# Patient Record
Sex: Female | Born: 1945 | Hispanic: No | Smoking: Former smoker
Health system: Southern US, Community
[De-identification: ages and names within clinical notes are randomized; demographics above are authoritative.]

## PROBLEM LIST (undated history)

## (undated) DIAGNOSIS — I10 Essential (primary) hypertension: Secondary | ICD-10-CM

## (undated) DIAGNOSIS — D573 Sickle-cell trait: Secondary | ICD-10-CM

## (undated) DIAGNOSIS — Z720 Tobacco use: Secondary | ICD-10-CM

## (undated) HISTORY — DX: Sickle-cell trait: D57.3

---

## 1999-10-31 ENCOUNTER — Other Ambulatory Visit: Admission: RE | Admit: 1999-10-31 | Discharge: 1999-10-31 | Payer: Self-pay | Admitting: Gynecology

## 2000-10-31 ENCOUNTER — Other Ambulatory Visit: Admission: RE | Admit: 2000-10-31 | Discharge: 2000-10-31 | Payer: Self-pay | Admitting: Gynecology

## 2004-09-08 ENCOUNTER — Other Ambulatory Visit: Admission: RE | Admit: 2004-09-08 | Discharge: 2004-09-08 | Payer: Self-pay | Admitting: Gynecology

## 2017-02-13 ENCOUNTER — Ambulatory Visit (INDEPENDENT_AMBULATORY_CARE_PROVIDER_SITE_OTHER): Payer: Medicare PPO | Admitting: Urgent Care

## 2017-02-13 DIAGNOSIS — Z23 Encounter for immunization: Secondary | ICD-10-CM | POA: Diagnosis not present

## 2017-02-13 DIAGNOSIS — R030 Elevated blood-pressure reading, without diagnosis of hypertension: Secondary | ICD-10-CM | POA: Diagnosis not present

## 2017-02-13 DIAGNOSIS — M25561 Pain in right knee: Secondary | ICD-10-CM | POA: Diagnosis not present

## 2017-02-13 DIAGNOSIS — M25511 Pain in right shoulder: Secondary | ICD-10-CM | POA: Diagnosis not present

## 2017-02-13 DIAGNOSIS — M545 Low back pain, unspecified: Secondary | ICD-10-CM

## 2017-02-13 MED ORDER — NAPROXEN SODIUM 550 MG PO TABS: 550.0000 mg | ORAL_TABLET | Freq: Two times a day (BID) | ORAL | 1 refills | Status: DC

## 2017-02-13 MED ORDER — CYCLOBENZAPRINE HCL 5 MG PO TABS: 5.0000 mg | ORAL_TABLET | Freq: Three times a day (TID) | ORAL | 1 refills | Status: AC | PRN
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
## 2017-02-22 ENCOUNTER — Ambulatory Visit (INDEPENDENT_AMBULATORY_CARE_PROVIDER_SITE_OTHER): Payer: Medicare PPO | Admitting: Urgent Care

## 2017-02-22 ENCOUNTER — Encounter: Payer: Self-pay | Admitting: Urgent Care

## 2017-02-22 VITALS — BP 170/84 | HR 82 | Temp 98.7°F | Resp 16 | Ht 65.5 in | Wt 184.0 lb

## 2017-02-22 DIAGNOSIS — M25511 Pain in right shoulder: Secondary | ICD-10-CM

## 2017-02-22 DIAGNOSIS — R030 Elevated blood-pressure reading, without diagnosis of hypertension: Secondary | ICD-10-CM

## 2017-02-22 DIAGNOSIS — I10 Essential (primary) hypertension: Secondary | ICD-10-CM

## 2017-02-22 DIAGNOSIS — F329 Major depressive disorder, single episode, unspecified: Secondary | ICD-10-CM

## 2017-02-22 DIAGNOSIS — F32A Depression, unspecified: Secondary | ICD-10-CM

## 2017-02-22 MED ORDER — AMLODIPINE BESYLATE 5 MG PO TABS: 5.0000 mg | ORAL_TABLET | Freq: Every day | ORAL | 3 refills | Status: AC
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
## 2017-03-29 ENCOUNTER — Ambulatory Visit: Payer: Medicare PPO | Admitting: Urgent Care

## 2020-09-07 ENCOUNTER — Other Ambulatory Visit: Payer: Self-pay

## 2020-09-10 ENCOUNTER — Inpatient Hospital Stay (HOSPITAL_COMMUNITY)
Admission: EM | Admit: 2020-09-10 | Discharge: 2020-10-11 | DRG: 208 | Disposition: E | Payer: Medicare PPO | Attending: Internal Medicine | Admitting: Internal Medicine

## 2020-09-10 ENCOUNTER — Encounter (HOSPITAL_COMMUNITY): Payer: Self-pay | Admitting: Internal Medicine

## 2020-09-10 ENCOUNTER — Emergency Department (HOSPITAL_COMMUNITY): Payer: Medicare PPO

## 2020-09-10 DIAGNOSIS — Z66 Do not resuscitate: Secondary | ICD-10-CM | POA: Diagnosis not present

## 2020-09-10 DIAGNOSIS — R54 Age-related physical debility: Secondary | ICD-10-CM | POA: Diagnosis present

## 2020-09-10 DIAGNOSIS — Z833 Family history of diabetes mellitus: Secondary | ICD-10-CM

## 2020-09-10 DIAGNOSIS — D72829 Elevated white blood cell count, unspecified: Secondary | ICD-10-CM | POA: Diagnosis not present

## 2020-09-10 DIAGNOSIS — I351 Nonrheumatic aortic (valve) insufficiency: Secondary | ICD-10-CM | POA: Diagnosis not present

## 2020-09-10 DIAGNOSIS — I4891 Unspecified atrial fibrillation: Secondary | ICD-10-CM | POA: Diagnosis not present

## 2020-09-10 DIAGNOSIS — I82453 Acute embolism and thrombosis of peroneal vein, bilateral: Secondary | ICD-10-CM | POA: Diagnosis present

## 2020-09-10 DIAGNOSIS — E875 Hyperkalemia: Secondary | ICD-10-CM | POA: Diagnosis not present

## 2020-09-10 DIAGNOSIS — N179 Acute kidney failure, unspecified: Secondary | ICD-10-CM | POA: Diagnosis present

## 2020-09-10 DIAGNOSIS — J9601 Acute respiratory failure with hypoxia: Secondary | ICD-10-CM | POA: Diagnosis present

## 2020-09-10 DIAGNOSIS — U071 COVID-19: Secondary | ICD-10-CM | POA: Diagnosis present

## 2020-09-10 DIAGNOSIS — I10 Essential (primary) hypertension: Secondary | ICD-10-CM | POA: Diagnosis present

## 2020-09-10 DIAGNOSIS — R7401 Elevation of levels of liver transaminase levels: Secondary | ICD-10-CM | POA: Diagnosis present

## 2020-09-10 DIAGNOSIS — I82451 Acute embolism and thrombosis of right peroneal vein: Secondary | ICD-10-CM | POA: Diagnosis present

## 2020-09-10 DIAGNOSIS — Z87891 Personal history of nicotine dependence: Secondary | ICD-10-CM | POA: Diagnosis not present

## 2020-09-10 DIAGNOSIS — Z515 Encounter for palliative care: Secondary | ICD-10-CM | POA: Diagnosis not present

## 2020-09-10 DIAGNOSIS — R739 Hyperglycemia, unspecified: Secondary | ICD-10-CM | POA: Diagnosis present

## 2020-09-10 DIAGNOSIS — Z0189 Encounter for other specified special examinations: Secondary | ICD-10-CM

## 2020-09-10 DIAGNOSIS — R748 Abnormal levels of other serum enzymes: Secondary | ICD-10-CM | POA: Diagnosis not present

## 2020-09-10 DIAGNOSIS — I82403 Acute embolism and thrombosis of unspecified deep veins of lower extremity, bilateral: Secondary | ICD-10-CM | POA: Diagnosis not present

## 2020-09-10 DIAGNOSIS — I361 Nonrheumatic tricuspid (valve) insufficiency: Secondary | ICD-10-CM | POA: Diagnosis not present

## 2020-09-10 DIAGNOSIS — I82441 Acute embolism and thrombosis of right tibial vein: Secondary | ICD-10-CM | POA: Diagnosis present

## 2020-09-10 DIAGNOSIS — J1282 Pneumonia due to coronavirus disease 2019: Secondary | ICD-10-CM | POA: Diagnosis present

## 2020-09-10 DIAGNOSIS — D75839 Thrombocytosis, unspecified: Secondary | ICD-10-CM | POA: Diagnosis present

## 2020-09-10 DIAGNOSIS — R0902 Hypoxemia: Secondary | ICD-10-CM

## 2020-09-10 DIAGNOSIS — Z7189 Other specified counseling: Secondary | ICD-10-CM | POA: Diagnosis not present

## 2020-09-10 DIAGNOSIS — D573 Sickle-cell trait: Secondary | ICD-10-CM | POA: Diagnosis present

## 2020-09-10 DIAGNOSIS — I82443 Acute embolism and thrombosis of tibial vein, bilateral: Secondary | ICD-10-CM | POA: Diagnosis present

## 2020-09-10 DIAGNOSIS — R0603 Acute respiratory distress: Secondary | ICD-10-CM

## 2020-09-10 DIAGNOSIS — N170 Acute kidney failure with tubular necrosis: Secondary | ICD-10-CM | POA: Diagnosis present

## 2020-09-10 DIAGNOSIS — B370 Candidal stomatitis: Secondary | ICD-10-CM | POA: Diagnosis not present

## 2020-09-10 DIAGNOSIS — I82442 Acute embolism and thrombosis of left tibial vein: Secondary | ICD-10-CM | POA: Diagnosis present

## 2020-09-10 DIAGNOSIS — R0602 Shortness of breath: Secondary | ICD-10-CM

## 2020-09-10 DIAGNOSIS — R7989 Other specified abnormal findings of blood chemistry: Secondary | ICD-10-CM | POA: Diagnosis not present

## 2020-09-10 DIAGNOSIS — I82452 Acute embolism and thrombosis of left peroneal vein: Secondary | ICD-10-CM | POA: Diagnosis present

## 2020-09-10 HISTORY — DX: Essential (primary) hypertension: I10

## 2020-09-10 HISTORY — DX: Tobacco use: Z72.0

## 2020-09-10 LAB — BRAIN NATRIURETIC PEPTIDE: B Natriuretic Peptide: 24.5 pg/mL (ref 0.0–100.0)

## 2020-09-10 LAB — COMPREHENSIVE METABOLIC PANEL
ALT: 52 U/L — ABNORMAL HIGH (ref 0–44)
AST: 46 U/L — ABNORMAL HIGH (ref 15–41)
Albumin: 3.1 g/dL — ABNORMAL LOW (ref 3.5–5.0)
Alkaline Phosphatase: 67 U/L (ref 38–126)
Anion gap: 17 — ABNORMAL HIGH (ref 5–15)
BUN: 64 mg/dL — ABNORMAL HIGH (ref 8–23)
CO2: 17 mmol/L — ABNORMAL LOW (ref 22–32)
Calcium: 9.1 mg/dL (ref 8.9–10.3)
Chloride: 107 mmol/L (ref 98–111)
Creatinine, Ser: 1.37 mg/dL — ABNORMAL HIGH (ref 0.44–1.00)
GFR calc Af Amer: 44 mL/min — ABNORMAL LOW (ref >60)
GFR calc non Af Amer: 38 mL/min — ABNORMAL LOW (ref >60)
Glucose, Bld: 197 mg/dL — ABNORMAL HIGH (ref 70–99)
Potassium: 4.2 mmol/L (ref 3.5–5.1)
Sodium: 141 mmol/L (ref 135–145)
Total Bilirubin: 1.3 mg/dL — ABNORMAL HIGH (ref 0.3–1.2)
Total Protein: 7.7 g/dL (ref 6.5–8.1)

## 2020-09-10 LAB — CBC WITH DIFFERENTIAL/PLATELET
Abs Immature Granulocytes: 0 10*3/uL (ref 0.00–0.07)
Basophils Absolute: 0 10*3/uL (ref 0.0–0.1)
Basophils Relative: 0 %
Eosinophils Absolute: 0 10*3/uL (ref 0.0–0.5)
Eosinophils Relative: 0 %
HCT: 40.5 % (ref 36.0–46.0)
Hemoglobin: 14.2 g/dL (ref 12.0–15.0)
Lymphocytes Relative: 14 %
Lymphs Abs: 1.4 10*3/uL (ref 0.7–4.0)
MCH: 27.4 pg (ref 26.0–34.0)
MCHC: 35.1 g/dL (ref 30.0–36.0)
MCV: 78.2 fL — ABNORMAL LOW (ref 80.0–100.0)
Monocytes Absolute: 0.5 10*3/uL (ref 0.1–1.0)
Monocytes Relative: 5 %
Neutro Abs: 8.1 10*3/uL — ABNORMAL HIGH (ref 1.7–7.7)
Neutrophils Relative %: 81 %
Platelets: 552 10*3/uL — ABNORMAL HIGH (ref 150–400)
RBC: 5.18 MIL/uL — ABNORMAL HIGH (ref 3.87–5.11)
RDW: 12.2 % (ref 11.5–15.5)
WBC: 10 10*3/uL (ref 4.0–10.5)
nRBC: 0 % (ref 0.0–0.2)
nRBC: 1 /100 WBC — ABNORMAL HIGH

## 2020-09-10 LAB — LACTIC ACID, PLASMA
Lactic Acid, Venous: 3.1 mmol/L (ref 0.5–1.9)
Lactic Acid, Venous: 1.9 mmol/L (ref 0.5–1.9)

## 2020-09-10 LAB — CREATININE, SERUM
Creatinine, Ser: 0.95 mg/dL (ref 0.44–1.00)
GFR calc Af Amer: >60 mL/min (ref >60)
GFR calc non Af Amer: 59 mL/min — ABNORMAL LOW (ref >60)

## 2020-09-10 LAB — RESPIRATORY PANEL BY RT PCR (FLU A&B, COVID)
Influenza A by PCR: NEGATIVE
Influenza B by PCR: NEGATIVE
SARS Coronavirus 2 by RT PCR: POSITIVE — AB

## 2020-09-10 LAB — CBC
HCT: 40.5 % (ref 36.0–46.0)
Hemoglobin: 14.6 g/dL (ref 12.0–15.0)
MCH: 27.8 pg (ref 26.0–34.0)
MCHC: 36 g/dL (ref 30.0–36.0)
MCV: 77.1 fL — ABNORMAL LOW (ref 80.0–100.0)
Platelets: 461 10*3/uL — ABNORMAL HIGH (ref 150–400)
RBC: 5.25 MIL/uL — ABNORMAL HIGH (ref 3.87–5.11)
RDW: 12.1 % (ref 11.5–15.5)
WBC: 7.7 10*3/uL (ref 4.0–10.5)
nRBC: 0 % (ref 0.0–0.2)

## 2020-09-10 LAB — LACTATE DEHYDROGENASE: LDH: 570 U/L — ABNORMAL HIGH (ref 98–192)

## 2020-09-10 LAB — FIBRINOGEN: Fibrinogen: >800 mg/dL — ABNORMAL HIGH (ref 210–475)

## 2020-09-10 LAB — D-DIMER, QUANTITATIVE: D-Dimer, Quant: 3.17 ug/mL-FEU — ABNORMAL HIGH (ref 0.00–0.50)

## 2020-09-10 LAB — PROCALCITONIN: Procalcitonin: <0.1 ng/mL

## 2020-09-10 LAB — FERRITIN: Ferritin: 1946 ng/mL — ABNORMAL HIGH (ref 11–307)

## 2020-09-10 LAB — TROPONIN I (HIGH SENSITIVITY): Troponin I (High Sensitivity): 12 ng/L (ref <18)

## 2020-09-10 LAB — C-REACTIVE PROTEIN: CRP: 18.8 mg/dL — ABNORMAL HIGH (ref <1.0)

## 2020-09-10 LAB — TRIGLYCERIDES: Triglycerides: 153 mg/dL — ABNORMAL HIGH (ref <150)

## 2020-09-10 MED ORDER — ONDANSETRON HCL 4 MG PO TABS: 4.0000 mg | ORAL_TABLET | Freq: Four times a day (QID) | ORAL | Status: DC | PRN

## 2020-09-10 MED ORDER — ASCORBIC ACID 500 MG PO TABS
500.0000 mg | ORAL_TABLET | Freq: Every day | ORAL | Status: DC
  Administered 2020-09-10 – 2020-10-04 (×25): 500 mg via ORAL
  Filled 2020-09-10 (×26): qty 1

## 2020-09-10 MED ORDER — SODIUM CHLORIDE 0.9 % IV SOLN
100.0000 mg | Freq: Every day | INTRAVENOUS | Status: AC
  Administered 2020-09-11 – 2020-09-14 (×4): 100 mg via INTRAVENOUS
  Filled 2020-09-10 (×4): qty 20

## 2020-09-10 MED ORDER — LACTATED RINGERS IV SOLN: INTRAVENOUS | Status: DC

## 2020-09-10 MED ORDER — METHYLPREDNISOLONE SODIUM SUCC 125 MG IJ SOLR
1.0000 mg/kg | Freq: Two times a day (BID) | INTRAMUSCULAR | Status: DC
  Administered 2020-09-10 – 2020-09-11 (×2): 78.75 mg via INTRAVENOUS
  Filled 2020-09-10 (×2): qty 2

## 2020-09-10 MED ORDER — SODIUM CHLORIDE 0.9 % IV SOLN
2.0000 g | INTRAVENOUS | Status: DC
  Administered 2020-09-10: 2 g via INTRAVENOUS
  Filled 2020-09-10: qty 20

## 2020-09-10 MED ORDER — SODIUM CHLORIDE 0.9 % IV SOLN
500.0000 mg | INTRAVENOUS | Status: DC
  Administered 2020-09-10: 500 mg via INTRAVENOUS
  Filled 2020-09-10: qty 500

## 2020-09-10 MED ORDER — ENOXAPARIN SODIUM 40 MG/0.4ML ~~LOC~~ SOLN
40.0000 mg | SUBCUTANEOUS | Status: DC
  Administered 2020-09-10: 40 mg via SUBCUTANEOUS
  Filled 2020-09-10: qty 0.4

## 2020-09-10 MED ORDER — GUAIFENESIN-DM 100-10 MG/5ML PO SYRP
10.0000 mL | ORAL_SOLUTION | ORAL | Status: DC | PRN
  Filled 2020-09-10: qty 10

## 2020-09-10 MED ORDER — DEXAMETHASONE SODIUM PHOSPHATE 10 MG/ML IJ SOLN: 10.0000 mg | Freq: Once | INTRAMUSCULAR | Status: DC

## 2020-09-10 MED ORDER — BARICITINIB 2 MG PO TABS
2.0000 mg | ORAL_TABLET | Freq: Every day | ORAL | Status: DC
  Administered 2020-09-10 – 2020-09-13 (×4): 2 mg via ORAL
  Filled 2020-09-10 (×4): qty 1

## 2020-09-10 MED ORDER — PREDNISONE 20 MG PO TABS: 50.0000 mg | ORAL_TABLET | Freq: Every day | ORAL | Status: DC

## 2020-09-10 MED ORDER — ONDANSETRON HCL 4 MG/2ML IJ SOLN
4.0000 mg | Freq: Four times a day (QID) | INTRAMUSCULAR | Status: DC | PRN
  Administered 2020-09-21 – 2020-09-22 (×2): 4 mg via INTRAVENOUS
  Filled 2020-09-10 (×2): qty 2

## 2020-09-10 MED ORDER — ZINC SULFATE 220 (50 ZN) MG PO CAPS
220.0000 mg | ORAL_CAPSULE | Freq: Every day | ORAL | Status: DC
  Administered 2020-09-10 – 2020-10-04 (×25): 220 mg via ORAL
  Filled 2020-09-10 (×26): qty 1

## 2020-09-10 MED ORDER — SODIUM CHLORIDE 0.9 % IV SOLN
200.0000 mg | Freq: Once | INTRAVENOUS | Status: AC
  Administered 2020-09-10: 200 mg via INTRAVENOUS
  Filled 2020-09-10: qty 40

## 2020-09-10 MED ORDER — HYDROCOD POLST-CPM POLST ER 10-8 MG/5ML PO SUER
5.0000 mL | Freq: Two times a day (BID) | ORAL | Status: DC | PRN
  Administered 2020-09-13: 5 mL via ORAL
  Filled 2020-09-10: qty 5

## 2020-09-10 MED ORDER — LACTATED RINGERS IV BOLUS (SEPSIS)
1000.0000 mL | Freq: Once | INTRAVENOUS | Status: AC
  Administered 2020-09-10: 1000 mL via INTRAVENOUS

## 2020-09-10 MED ORDER — ACETAMINOPHEN 325 MG PO TABS
650.0000 mg | ORAL_TABLET | Freq: Four times a day (QID) | ORAL | Status: DC | PRN
  Administered 2020-09-21 – 2020-09-23 (×4): 650 mg via ORAL
  Filled 2020-09-10 (×4): qty 2

## 2020-09-10 NOTE — H&P (Signed)
History and Physical    [place] IBB:[id number] DOB: [birth year] DOA: Sep 13, 2020  PCP: Pcp, No  Patient coming from: Home  I have personally briefly reviewed patient's old medical records in [facility]  Chief Complaint: Shortness of breath since 4 days  HPI: Hannah Torres is a 74 y.o. female with medical history significant of hypertension, former smoker presents to emergency department with worsening shortness of breath since 4 days.  Patient reports worsening shortness of breath since 4 days, associated with low-grade fever, cough with some sputum production, generalized weakness, lethargic, no appetite.  She is not vaccinated against COVID-19.  Denies headache, blurry vision, chest pain, recent travel, bone pain, nausea, vomiting, urinary or bowel changes.  Quit smoking 2 months ago, drinks alcohol occasionally.  Denies illicit drug use.    ED Course: Upon arrival to ED: Patient tachycardic, tachypneic, hypoxic requiring 10 L via NRB.  Afebrile with no leukocytosis, lactic acid: 3.1, platelet: 552, AKI, AST: 46, ALT: 52, total bilirubin: 1.3, COVID-19 positive, CRP: 18.8, chest x-ray shows right greater than left lower lung predominantly interstitial opacities.  Differential considerations include atypical infection including COVID-19 pneumonia or an atypical appearance of pulmonary edema.  Patient was given azithromycin, Rocephin, IV fluid bolus in ED.  [facility] hospitalist consulted for admission for acute hypoxemic respiratory failure secondary to COVID-19 pneumonia.  Review of Systems: As per HPI otherwise negative.    Past Medical History:  Diagnosis Date  . HTN (hypertension)   . Sickle cell trait (HCC)   . Tobacco abuse     History reviewed. No pertinent surgical history.   reports that she has quit smoking. She has never used smokeless tobacco. She reports previous alcohol use. She reports that she does not use drugs.  No Known Allergies  Family History    Problem Relation Age of Onset  . Cancer Mother   . Diabetes Mother     Prior to Admission medications   Medication Sig Start Date End Date Taking? Authorizing Provider  amLODipine (NORVASC) 5 MG tablet Take 1 tablet (5 mg total) by mouth daily. 02/22/17   Wallis Bamberg, PA-C  cyclobenzaprine (FLEXERIL) 5 MG tablet Take 1 tablet (5 mg total) by mouth 3 (three) times daily as needed. 02/13/17   Wallis Bamberg, PA-C    Physical Exam: Vitals:   2020-09-13 1345 09/13/2020 1415 2020/09/13 1445 Sep 13, 2020 1515  BP: (!) 137/108  128/74 130/65  Pulse: (!) 118 (!) 110 (!) 104 (!) 110  Resp: (!) 45 (!) 45 (!) 33 (!) 43  SpO2: 97% 94% 92%     Constitutional:, In in respiratory distress, on NRB, using accessory muscles Eyes: PERRL, lids and conjunctivae normal ENMT: Mucous membranes are moist. Posterior pharynx clear of any exudate or lesions.Normal dentition.  Neck: normal, supple, no masses, no thyromegaly Respiratory: Tachypneic, using accessory muscles.  No wheezing, crackles. Cardiovascular: Tachycardic, no murmurs / rubs / gallops. No extremity edema. 2+ pedal pulses. No carotid bruits.  Abdomen: no tenderness, no masses palpated. No hepatosplenomegaly. Bowel sounds positive.  Musculoskeletal: no clubbing / cyanosis. No joint deformity upper and lower extremities. Good ROM, no contractures. Normal muscle tone.  Skin: no rashes, lesions, ulcers. No induration Neurologic: CN 2-12 grossly intact. Sensation intact, DTR normal. Strength 5/5 in all 4.  Psychiatric: Normal judgment and insight. Alert and oriented x 3. Normal mood.    Labs on Admission: I have personally reviewed following labs and imaging studies  CBC: Recent Labs  Lab 2020-09-13 1335  WBC 10.0  NEUTROABS 8.1*  HGB 14.2  HCT 40.5  MCV 78.2*  PLT 552*   Basic Metabolic Panel: Recent Labs  Lab 09/13/2020 1335  NA 141  K 4.2  CL 107  CO2 17*  GLUCOSE 197*  BUN 64*  CREATININE 1.37*  CALCIUM 9.1   GFR: CrCl cannot be  calculated (Unknown ideal weight.). Liver Function Tests: Recent Labs  Lab 10/03/2020 1335  AST 46*  ALT 52*  ALKPHOS 67  BILITOT 1.3*  PROT 7.7  ALBUMIN 3.1*   No results for input(s): LIPASE, AMYLASE in the last 168 hours. No results for input(s): AMMONIA in the last 168 hours. Coagulation Profile: No results for input(s): INR, PROTIME in the last 168 hours. Cardiac Enzymes: No results for input(s): CKTOTAL, CKMB, CKMBINDEX, TROPONINI in the last 168 hours. BNP (last 3 results) No results for input(s): PROBNP in the last 8760 hours. HbA1C: No results for input(s): HGBA1C in the last 72 hours. CBG: No results for input(s): GLUCAP in the last 168 hours. Lipid Profile: Recent Labs    09/16/2020 1335  TRIG 153*   Thyroid Function Tests: No results for input(s): TSH, T4TOTAL, FREET4, T3FREE, THYROIDAB in the last 72 hours. Anemia Panel: Recent Labs    09/26/2020 1335  FERRITIN 1,946*   Urine analysis: No results found for: COLORURINE, APPEARANCEUR, LABSPEC, PHURINE, GLUCOSEU, HGBUR, BILIRUBINUR, KETONESUR, PROTEINUR, UROBILINOGEN, NITRITE, LEUKOCYTESUR  Radiological Exams on Admission: DG Chest Port 1 View  Result Date: 09/21/2020 CLINICAL DATA:  Shortness of breath for 4 days. Cough. Fever. Tachycardia. EXAM: PORTABLE CHEST 1 VIEW COMPARISON:  None. FINDINGS: Midline trachea. Normal heart size. No pleural effusion or pneumothorax. Lower lung predominant interstitial opacities, slightly greater on the right than left. Numerous leads and wires project over the chest. IMPRESSION: Right greater than left lower lung predominant interstitial opacities. Differential considerations include atypical infection (including COVID-19 pneumonia) or an atypical appearance of pulmonary edema (given lack of pleural fluid). Electronically Signed   By: Jeronimo Greaves M.D.   On: 10/08/2020 14:06    EKG: Independently reviewed.  Sinus tachycardia.  No ST elevation or depression  noted.  Assessment/Plan Principal Problem:   Acute hypoxemic respiratory failure due to COVID-19 [facility]) Active Problems:   Elevated liver enzymes   Hypertension   AKI (acute kidney injury) (HCC)   Thrombocytosis    Acute hypoxemic respiratory failure secondary to COVID-19 pneumonia: Patient presented with tachycardia, tachypnea, afebrile with no leukocytosis, on 8L via NRB, reviewed chest x-ray.  COVID-19 positive.  Lactic acid: 3.1 -Received azithromycin, Rocephin and IV fluid bolus in ED. -All inflammatory markers elevated.   -Admit patient stepdown unit for close monitoring.  On continuous pulse ox.  We will try to wean off of oxygen as tolerated. -Start patient on remdesivir, steroids, baricitinib.  Monitor vitals closely.  Continue Rocephin and azithromycin. -Discussed with the patient and her daughter on the phone regarding baricitinib (which is non-FDA approved)- benefits and risk factors.  Patient denies History of tuberculosis or hepatitis and wants to proceed with baricitinib. -Repeat inflammatory markers tomorrow AM -Start on p.o. vitamin, antitussive, albuterol every 6 hours -Monitor vitals closely.  Hold further IV fluids.  Elevated liver enzymes: AST: 46, LT: 52, total bilirubin: 1.3 -In the setting of COVID-19 infection.  Repeat CMP tomorrow AM.  AKI: No baseline kidney function in the system.  Received IV fluid in the ED.  Avoid nephrotoxic medication.  Repeat BMP tomorrow a.m.  Hypertension: Stable: Hold amlodipine for now.  Thrombocytosis: Platelet:  552 -Likely reactive due to COVID-19 infection.  Repeat CBC tomorrow a.m.  DVT prophylaxis: Lovenox/SCD Code Status: Full code Family Communication: None present at bedside.  Plan of care discussed with patient in length and he verbalized understanding and agreed with it.  I called patient's daughter Hannah Torres and discussed plan of care & prognosis and she verbalized understanding.   Disposition Plan: Home in 4 to 5  days Consults called: None Admission status: Inpatient   Ollen Bowl MD [facility] Pager [phone]  If 7PM-7AM, please contact night-coverage [email or web address] Password [facility]  09/27/2020, 6:36 PM

## 2020-09-10 NOTE — Progress Notes (Signed)
Pt seen on rounds while holding in Er for progressive unit bed.  Pt is stable and doing well and hemodynamically stable.  Is now on HFNC and maintaining O2 sats.   No issues per RN.  Continue current care.

## 2020-09-10 NOTE — ED Provider Notes (Signed)
Pt signed out by Dr. Jacqulyn Bath pending Covid results.  Pt's Covid test is positive.  She was titrated to 8 L on the NRB.  She said her breathing is much better on the oxygen.  HR is still elevated.  IVFs going in.  Pharmacy consulted for Remdesivir. Pt also given decadron/rocephin/zithromax.  Pt d/w Dr. Jacqulyn Bath ([facility]) for admission.  CRITICAL CARE Performed by: Jacalyn Lefevre   Total critical care time: 30 minutes  Critical care time was exclusive of separately billable procedures and treating other patients.  Critical care was necessary to treat or prevent imminent or life-threatening deterioration.  Critical care was time spent personally by me on the following activities: development of treatment plan with patient and/or surrogate as well as nursing, discussions with consultants, evaluation of patient's response to treatment, examination of patient, obtaining history from patient or surrogate, ordering and performing treatments and interventions, ordering and review of laboratory studies, ordering and review of radiographic studies, pulse oximetry and re-evaluation of patient's condition.  Hannah Torres was evaluated in Emergency Department on 09-29-2020 for the symptoms described in the history of present illness. She was evaluated in the context of the global COVID-19 pandemic, which necessitated consideration that the patient might be at risk for infection with the SARS-CoV-2 virus that causes COVID-19. Institutional protocols and algorithms that pertain to the evaluation of patients at risk for COVID-19 are in a state of rapid change based on information released by regulatory bodies including the CDC and federal and state organizations. These policies and algorithms were followed during the patient's care in the ED.   Jacalyn Lefevre, MD 09/29/20 1724

## 2020-09-10 NOTE — ED Provider Notes (Signed)
Emergency Department Provider Note   I have reviewed the triage vital signs and the nursing notes.   HISTORY  Chief Complaint Shortness of Breath   HPI Hannah Torres is a 74 y.o. female with past medical history reviewed below presents to the emergency department with shortness of breath worsening over the past 4 to 5 days.  Patient lives with several people who have recently been diagnosed with COVID-19.  She is not vaccinated.  She has developed some occasional cough but mainly significant shortness of breath.  Symptoms have worsened significantly over the past 24 hours.  She denies chest pain but does have some discomfort with coughing.  Her cough is slightly productive and she is also had diarrhea.  She denies abdominal pain or back pain.  She has occasional headache with some body aches.  No radiation of symptoms or other modifying factors.  No prior history of respiratory disease but does have a smoking history.  She does not use inhalers regularly.     Past Medical History:  Diagnosis Date  . HTN (hypertension)   . Sickle cell trait (HCC)   . Tobacco abuse     Patient Active Problem List   Diagnosis Date Noted  . Acute hypoxemic respiratory failure due to COVID-19 (HCC) 09/24/2020  . Elevated liver enzymes 09/22/2020  . Hypertension 09/30/2020  . AKI (acute kidney injury) (HCC) 09/15/2020  . Thrombocytosis 09/13/2020   Allergies Patient has no known allergies.  Family History  Problem Relation Age of Onset  . Cancer Mother   . Diabetes Mother     Social History Social History   Tobacco Use  . Smoking status: Former Games developer  . Smokeless tobacco: Never Used  Substance Use Topics  . Alcohol use: Not Currently  . Drug use: Never    Review of Systems  Constitutional: No fever/chills Eyes: No visual changes. ENT: Mild sore throat and nasal congestion.  Cardiovascular: Denies chest pain. Respiratory: Positive shortness of breath and cough.    Gastrointestinal: No abdominal pain.  No nausea, no vomiting. Positive diarrhea.  No constipation. Genitourinary: Negative for dysuria. Musculoskeletal: Negative for back pain. Skin: Negative for rash. Neurological: Negative for headaches, focal weakness or numbness.  10-point ROS otherwise negative.  ____________________________________________   PHYSICAL EXAM:  VITAL SIGNS: ED Triage Vitals [10/07/2020 1325]  Enc Vitals Group     BP 137/61     Pulse Rate (!) 130     Resp (!) 32     Temp      Temp src      SpO2 (!) 70 %   Constitutional: Alert and oriented. Increased WOB noted on exam.  Eyes: Conjunctivae are normal.  Head: Atraumatic. Nose: No congestion/rhinnorhea. Mouth/Throat: Mucous membranes are moist. Neck: No stridor.   Cardiovascular: Tachycardia. Good peripheral circulation. Grossly normal heart sounds.   Respiratory: Increased respiratory effort.  No retractions. Lungs with rhonchi at the bases. No significant wheezing. Reasonable air entry.  Gastrointestinal: Soft and nontender. No distention.  Musculoskeletal: No lower extremity tenderness nor edema. No gross deformities of extremities. Neurologic:  Normal speech and language.  Skin:  Skin is warm, dry and intact. No rash noted.   ____________________________________________   LABS (all labs ordered are listed, but only abnormal results are displayed)  Labs Reviewed  RESPIRATORY PANEL BY RT PCR (FLU A&B, COVID) - Abnormal; Notable for the following components:      Result Value   SARS Coronavirus 2 by RT PCR POSITIVE (*)  All other components within normal limits  LACTIC ACID, PLASMA - Abnormal; Notable for the following components:   Lactic Acid, Venous 3.1 (*)    All other components within normal limits  CBC WITH DIFFERENTIAL/PLATELET - Abnormal; Notable for the following components:   RBC 5.18 (*)    MCV 78.2 (*)    Platelets 552 (*)    Neutro Abs 8.1 (*)    nRBC 1 (*)    All other  components within normal limits  COMPREHENSIVE METABOLIC PANEL - Abnormal; Notable for the following components:   CO2 17 (*)    Glucose, Bld 197 (*)    BUN 64 (*)    Creatinine, Ser 1.37 (*)    Albumin 3.1 (*)    AST 46 (*)    ALT 52 (*)    Total Bilirubin 1.3 (*)    GFR calc non Af Amer 38 (*)    GFR calc Af Amer 44 (*)    Anion gap 17 (*)    All other components within normal limits  D-DIMER, QUANTITATIVE (NOT AT [facility]) - Abnormal; Notable for the following components:   D-Dimer, Quant 3.17 (*)    All other components within normal limits  LACTATE DEHYDROGENASE - Abnormal; Notable for the following components:   LDH 570 (*)    All other components within normal limits  FERRITIN - Abnormal; Notable for the following components:   Ferritin 1,946 (*)    All other components within normal limits  FIBRINOGEN - Abnormal; Notable for the following components:   Fibrinogen >800 (*)    All other components within normal limits  C-REACTIVE PROTEIN - Abnormal; Notable for the following components:   CRP 18.8 (*)    All other components within normal limits  TRIGLYCERIDES - Abnormal; Notable for the following components:   Triglycerides 153 (*)    All other components within normal limits  CBC - Abnormal; Notable for the following components:   RBC 5.25 (*)    MCV 77.1 (*)    Platelets 461 (*)    All other components within normal limits  CREATININE, SERUM - Abnormal; Notable for the following components:   GFR calc non Af Amer 59 (*)    All other components within normal limits  CBC WITH DIFFERENTIAL/PLATELET - Abnormal; Notable for the following components:   Platelets 507 (*)    Abs Immature Granulocytes 0.25 (*)    All other components within normal limits  D-DIMER, QUANTITATIVE (NOT AT [facility]) - Abnormal; Notable for the following components:   D-Dimer, Quant 5.10 (*)    All other components within normal limits  C-REACTIVE PROTEIN - Abnormal; Notable for the following  components:   CRP 20.6 (*)    All other components within normal limits  FERRITIN - Abnormal; Notable for the following components:   Ferritin 1,978 (*)    All other components within normal limits  CBC WITH DIFFERENTIAL/PLATELET - Abnormal; Notable for the following components:   WBC 12.8 (*)    HCT 34.9 (*)    MCV 77.7 (*)    MCHC 36.1 (*)    Platelets 433 (*)    Neutro Abs 9.7 (*)    Abs Immature Granulocytes 0.66 (*)    All other components within normal limits  COMPREHENSIVE METABOLIC PANEL - Abnormal; Notable for the following components:   Chloride 112 (*)    CO2 19 (*)    Glucose, Bld 225 (*)    BUN 30 (*)    Albumin  2.6 (*)    AST 43 (*)    ALT 46 (*)    All other components within normal limits  C-REACTIVE PROTEIN - Abnormal; Notable for the following components:   CRP 13.0 (*)    All other components within normal limits  D-DIMER, QUANTITATIVE (NOT AT [facility]) - Abnormal; Notable for the following components:   D-Dimer, Quant >20.00 (*)    All other components within normal limits  CBC WITH DIFFERENTIAL/PLATELET - Abnormal; Notable for the following components:   WBC 18.0 (*)    MCV 77.9 (*)    MCHC 36.6 (*)    Platelets 483 (*)    Neutro Abs 15.0 (*)    Abs Immature Granulocytes 0.78 (*)    All other components within normal limits  COMPREHENSIVE METABOLIC PANEL - Abnormal; Notable for the following components:   Sodium 146 (*)    Chloride 115 (*)    CO2 18 (*)    Glucose, Bld 200 (*)    BUN 28 (*)    Total Protein 6.4 (*)    Albumin 2.2 (*)    All other components within normal limits  C-REACTIVE PROTEIN - Abnormal; Notable for the following components:   CRP 5.8 (*)    All other components within normal limits  D-DIMER, QUANTITATIVE (NOT AT [facility]) - Abnormal; Notable for the following components:   D-Dimer, Quant >20.00 (*)    All other components within normal limits  TSH - Abnormal; Notable for the following components:   TSH 0.157 (*)    All other  components within normal limits  HEPARIN LEVEL (UNFRACTIONATED) - Abnormal; Notable for the following components:   Heparin Unfractionated 0.80 (*)    All other components within normal limits  CBC WITH DIFFERENTIAL/PLATELET - Abnormal; Notable for the following components:   WBC 17.0 (*)    RBC 5.28 (*)    MCV 78.8 (*)    Platelets 512 (*)    Neutro Abs 14.6 (*)    Abs Immature Granulocytes 0.48 (*)    All other components within normal limits  COMPREHENSIVE METABOLIC PANEL - Abnormal; Notable for the following components:   Chloride 114 (*)    CO2 17 (*)    Glucose, Bld 193 (*)    BUN 31 (*)    Total Protein 6.3 (*)    Albumin 2.5 (*)    All other components within normal limits  C-REACTIVE PROTEIN - Abnormal; Notable for the following components:   CRP 3.1 (*)    All other components within normal limits  D-DIMER, QUANTITATIVE (NOT AT [facility]) - Abnormal; Notable for the following components:   D-Dimer, Quant >20.00 (*)    All other components within normal limits  T4, FREE - Abnormal; Notable for the following components:   Free T4 1.22 (*)    All other components within normal limits  HEMOGLOBIN A1C - Abnormal; Notable for the following components:   Hgb A1c MFr Bld 6.4 (*)    All other components within normal limits  GLUCOSE, CAPILLARY - Abnormal; Notable for the following components:   Glucose-Capillary 251 (*)    All other components within normal limits  GLUCOSE, CAPILLARY - Abnormal; Notable for the following components:   Glucose-Capillary 243 (*)    All other components within normal limits  GLUCOSE, CAPILLARY - Abnormal; Notable for the following components:   Glucose-Capillary 227 (*)    All other components within normal limits  GLUCOSE, CAPILLARY - Abnormal; Notable for the following components:  Glucose-Capillary 246 (*)    All other components within normal limits  CBC WITH DIFFERENTIAL/PLATELET - Abnormal; Notable for the following components:   WBC 20.9  (*)    RBC 5.30 (*)    Hemoglobin 15.1 (*)    MCV 77.9 (*)    MCHC 36.6 (*)    Platelets 512 (*)    Neutro Abs 18.4 (*)    Abs Immature Granulocytes 0.61 (*)    All other components within normal limits  COMPREHENSIVE METABOLIC PANEL - Abnormal; Notable for the following components:   CO2 19 (*)    Glucose, Bld 210 (*)    BUN 35 (*)    Albumin 2.6 (*)    All other components within normal limits  C-REACTIVE PROTEIN - Abnormal; Notable for the following components:   CRP 1.4 (*)    All other components within normal limits  D-DIMER, QUANTITATIVE (NOT AT [place]) - Abnormal; Notable for the following components:   D-Dimer, Quant >20.00 (*)    All other components within normal limits  GLUCOSE, CAPILLARY - Abnormal; Notable for the following components:   Glucose-Capillary 228 (*)    All other components within normal limits  GLUCOSE, CAPILLARY - Abnormal; Notable for the following components:   Glucose-Capillary 208 (*)    All other components within normal limits  GLUCOSE, CAPILLARY - Abnormal; Notable for the following components:   Glucose-Capillary 192 (*)    All other components within normal limits  GLUCOSE, CAPILLARY - Abnormal; Notable for the following components:   Glucose-Capillary 182 (*)    All other components within normal limits  GLUCOSE, CAPILLARY - Abnormal; Notable for the following components:   Glucose-Capillary 392 (*)    All other components within normal limits  GLUCOSE, CAPILLARY - Abnormal; Notable for the following components:   Glucose-Capillary 214 (*)    All other components within normal limits  CULTURE, BLOOD (ROUTINE X 2)  LACTIC ACID, PLASMA  PROCALCITONIN  BRAIN NATRIURETIC PEPTIDE  HEPATITIS B SURFACE ANTIGEN  MAGNESIUM  HEPARIN LEVEL (UNFRACTIONATED)  HEPARIN LEVEL (UNFRACTIONATED)  MAGNESIUM  MAGNESIUM  MAGNESIUM  BRAIN NATRIURETIC PEPTIDE  CBC  BASIC METABOLIC PANEL  TROPONIN I (HIGH SENSITIVITY)    ____________________________________________  EKG   EKG Interpretation  Date/Time:  Friday September 10 2020 14:50:24 EDT Ventricular Rate:  113 PR Interval:    QRS Duration: 81 QT Interval:  317 QTC Calculation: 435 R Axis:   -21 Text Interpretation: Sinus tachycardia Borderline left axis deviation No STEMI Confirmed by Alona Bene [phone]) on 10/10/2020 2:57:17 PM       ____________________________________________  RADIOLOGY  DG Chest Port 1 View  Result Date: 09/22/2020 CLINICAL DATA:  Shortness of breath for 4 days. Cough. Fever. Tachycardia. EXAM: PORTABLE CHEST 1 VIEW COMPARISON:  None. FINDINGS: Midline trachea. Normal heart size. No pleural effusion or pneumothorax. Lower lung predominant interstitial opacities, slightly greater on the right than left. Numerous leads and wires project over the chest. IMPRESSION: Right greater than left lower lung predominant interstitial opacities. Differential considerations include atypical infection (including COVID-19 pneumonia) or an atypical appearance of pulmonary edema (given lack of pleural fluid). Electronically Signed   By: Jeronimo Greaves M.D.   On: 09/15/2020 14:06    ____________________________________________   PROCEDURES  Procedure(s) performed:   Procedures  CRITICAL CARE Performed by: Maia Plan Total critical care time: 35 minutes Critical care time was exclusive of separately billable procedures and treating other patients. Critical care was necessary to treat or prevent imminent  or life-threatening deterioration. Critical care was time spent personally by me on the following activities: development of treatment plan with patient and/or surrogate as well as nursing, discussions with consultants, evaluation of patient's response to treatment, examination of patient, obtaining history from patient or surrogate, ordering and performing treatments and interventions, ordering and review of laboratory studies, ordering  and review of radiographic studies, pulse oximetry and re-evaluation of patient's condition.  Alona Bene, MD Emergency Medicine  ____________________________________________   INITIAL IMPRESSION / ASSESSMENT AND PLAN / ED COURSE  Pertinent labs & imaging results that were available during my care of the patient were reviewed by me and considered in my medical decision making (see chart for details).   Patient presents to the emergency department with high degree of suspicion for COVID-19 pneumonia.  X-ray is consistent with this diagnosis.  She is 70% on room air with increased work of breathing on arrival.  Heart rate and work of breathing have improved on nonrebreather and will try and wean if able.  Patient is not having chest pain although PE does remain on my differential.  Following labs including D-dimer along with inflammatory markers.  Have sent Covid PCR.  If positive plan for Decadron and remdesivir at that time. Patient is not requiring intubation at this time but given presentation she is at risk for deterioration.   Discussed patient's case with [facility] to request admission. Patient and family (if present) updated with plan. Care transferred to [facility] service.  I reviewed all nursing notes, vitals, pertinent old records, EKGs, labs, imaging (as available).  ____________________________________________  FINAL CLINICAL IMPRESSION(S) / ED DIAGNOSES  Final diagnoses:  Pneumonia due to COVID-19 virus  Acute respiratory failure with hypoxia (HCC)     MEDICATIONS GIVEN DURING THIS VISIT:  Medications  ascorbic acid (VITAMIN C) tablet 500 mg (500 mg Oral Given 09/15/20 0801)  zinc sulfate capsule 220 mg (220 mg Oral Given 09/15/20 0802)  guaiFENesin-dextromethorphan (ROBITUSSIN DM) 100-10 MG/5ML syrup 10 mL (has no administration in time range)  chlorpheniramine-HYDROcodone (TUSSIONEX) 10-8 MG/5ML suspension 5 mL (5 mLs Oral Given 09/13/20 0817)  acetaminophen (TYLENOL) tablet 650 mg  (has no administration in time range)  ondansetron (ZOFRAN) tablet 4 mg (has no administration in time range)    Or  ondansetron (ZOFRAN) injection 4 mg (has no administration in time range)  famotidine (PEPCID) tablet 20 mg (20 mg Oral Given 09/15/20 0801)  mometasone-formoterol (DULERA) 200-5 MCG/ACT inhaler 2 puff (2 puffs Inhalation Given 09/15/20 0806)  methylPREDNISolone sodium succinate (SOLU-MEDROL) 125 mg/2 mL injection 80 mg (80 mg Intravenous Given 09/15/20 0804)  influenza vaccine adjuvanted (FLUAD) injection 0.5 mL (0.5 mLs Intramuscular Not Given 09/13/20 0809)  metoprolol tartrate (LOPRESSOR) injection 2.5 mg (2.5 mg Intravenous Given 09/14/20 0748)  loperamide (IMODIUM) capsule 4 mg (4 mg Oral Given 09/13/20 1454)  baricitinib (OLUMIANT) tablet 4 mg (4 mg Oral Given 09/15/20 0802)  enoxaparin (LOVENOX) injection 75 mg (75 mg Subcutaneous Given 09/15/20 1234)  metoprolol tartrate (LOPRESSOR) tablet 75 mg (75 mg Oral Given 09/15/20 0802)  insulin aspart (novoLOG) injection 0-20 Units (7 Units Subcutaneous Given 09/15/20 1721)  insulin aspart (novoLOG) injection 0-5 Units (0 Units Subcutaneous Not Given 09/14/20 2128)  insulin glargine (LANTUS) injection 8 Units (8 Units Subcutaneous Given 09/14/20 2227)  diltiazem (CARDIZEM) 125 mg in dextrose 5% 125 mL (1 mg/mL) infusion (5 mg/hr Intravenous Restarted 09/15/20 1621)  lactated ringers bolus 1,000 mL (0 mLs Intravenous Stopped 09/26/2020 2228)  remdesivir 200 mg in sodium chloride 0.9%  250 mL IVPB (0 mg Intravenous Stopped 10/06/2020 2228)    Followed by  remdesivir 100 mg in sodium chloride 0.9 % 100 mL IVPB (100 mg Intravenous New Bag/Given 09/14/20 1104)  diltiazem (CARDIZEM) injection 10 mg (10 mg Intravenous Given 09/12/20 0943)  metoprolol tartrate (LOPRESSOR) injection 5 mg (5 mg Intravenous Given 09/12/20 1038)  furosemide (LASIX) injection 20 mg (20 mg Intravenous Given 09/13/20 1127)  metoprolol tartrate (LOPRESSOR) injection 5 mg (5 mg  Intravenous Given 09/13/20 1759)  furosemide (LASIX) injection 20 mg (20 mg Intravenous Given 09/14/20 1057)  diltiazem (CARDIZEM) 1 mg/mL load via infusion 10 mg (10 mg Intravenous Bolus from Bag 09/14/20 1224)    Note:  This document was prepared using Dragon voice recognition software and may include unintentional dictation errors.  Alona BeneJoshua Abdi Husak, MD, [facility] Emergency Medicine    Geetika Laborde, Arlyss RepressJoshua G, MD 09/15/20 2004

## 2020-09-10 NOTE — ED Notes (Signed)
Titrated pt from 15 L NRB to 10L to 8L NRB, able to tolerate at 95%. Attempted to transition to 6L [place], unable to maintain sats >85%. Pt now 92% on 8L NRB

## 2020-09-10 NOTE — ED Notes (Signed)
Unable to obtain 2nd blood culture, abx started to not delay sepsis care further

## 2020-09-10 NOTE — ED Triage Notes (Signed)
Pt arrives to ED w/ c/o sob x 4 days. Pt found to be 70% on RA in triage. Pt denies chest pain, endorses recent cough, and intermittent fever. Pt up to 96% on NRB. Pt HR 130 in triage.

## 2020-09-11 DIAGNOSIS — J1282 Pneumonia due to coronavirus disease 2019: Secondary | ICD-10-CM

## 2020-09-11 DIAGNOSIS — I10 Essential (primary) hypertension: Secondary | ICD-10-CM | POA: Diagnosis not present

## 2020-09-11 DIAGNOSIS — N179 Acute kidney failure, unspecified: Secondary | ICD-10-CM

## 2020-09-11 DIAGNOSIS — U071 COVID-19: Secondary | ICD-10-CM | POA: Diagnosis not present

## 2020-09-11 DIAGNOSIS — J9601 Acute respiratory failure with hypoxia: Secondary | ICD-10-CM | POA: Diagnosis not present

## 2020-09-11 LAB — CBC WITH DIFFERENTIAL/PLATELET
Abs Immature Granulocytes: 0.25 10*3/uL — ABNORMAL HIGH (ref 0.00–0.07)
Basophils Absolute: 0.1 10*3/uL (ref 0.0–0.1)
Basophils Relative: 1 %
Eosinophils Absolute: 0 10*3/uL (ref 0.0–0.5)
Eosinophils Relative: 1 %
HCT: 36.7 % (ref 36.0–46.0)
Hemoglobin: 12.7 g/dL (ref 12.0–15.0)
Immature Granulocytes: 4 %
Lymphocytes Relative: 20 %
Lymphs Abs: 1.2 10*3/uL (ref 0.7–4.0)
MCH: 28.1 pg (ref 26.0–34.0)
MCHC: 34.6 g/dL (ref 30.0–36.0)
MCV: 81.2 fL (ref 80.0–100.0)
Monocytes Absolute: 0.1 10*3/uL (ref 0.1–1.0)
Monocytes Relative: 2 %
Neutro Abs: 4.5 10*3/uL (ref 1.7–7.7)
Neutrophils Relative %: 72 %
Platelets: 507 10*3/uL — ABNORMAL HIGH (ref 150–400)
RBC: 4.52 MIL/uL (ref 3.87–5.11)
RDW: 14.7 % (ref 11.5–15.5)
WBC: 6.1 10*3/uL (ref 4.0–10.5)
nRBC: 0 % (ref 0.0–0.2)

## 2020-09-11 LAB — HEPATITIS B SURFACE ANTIGEN: Hepatitis B Surface Ag: NONREACTIVE

## 2020-09-11 LAB — FERRITIN: Ferritin: 1978 ng/mL — ABNORMAL HIGH (ref 11–307)

## 2020-09-11 LAB — C-REACTIVE PROTEIN: CRP: 20.6 mg/dL — ABNORMAL HIGH (ref <1.0)

## 2020-09-11 LAB — D-DIMER, QUANTITATIVE: D-Dimer, Quant: 5.1 ug/mL-FEU — ABNORMAL HIGH (ref 0.00–0.50)

## 2020-09-11 MED ORDER — ENOXAPARIN SODIUM 40 MG/0.4ML ~~LOC~~ SOLN
40.0000 mg | Freq: Two times a day (BID) | SUBCUTANEOUS | Status: DC
  Administered 2020-09-11: 40 mg via SUBCUTANEOUS
  Filled 2020-09-11: qty 0.4

## 2020-09-11 MED ORDER — INFLUENZA VAC A&B SA ADJ QUAD 0.5 ML IM PRSY
0.5000 mL | PREFILLED_SYRINGE | INTRAMUSCULAR | Status: DC
  Filled 2020-09-11: qty 0.5

## 2020-09-11 MED ORDER — FAMOTIDINE 20 MG PO TABS
20.0000 mg | ORAL_TABLET | Freq: Every day | ORAL | Status: DC
  Administered 2020-09-11 – 2020-10-04 (×24): 20 mg via ORAL
  Filled 2020-09-11 (×25): qty 1

## 2020-09-11 MED ORDER — METHYLPREDNISOLONE SODIUM SUCC 125 MG IJ SOLR
80.0000 mg | Freq: Two times a day (BID) | INTRAMUSCULAR | Status: DC
  Administered 2020-09-11 – 2020-09-17 (×13): 80 mg via INTRAVENOUS
  Filled 2020-09-11 (×12): qty 2

## 2020-09-11 MED ORDER — MOMETASONE FURO-FORMOTEROL FUM 200-5 MCG/ACT IN AERO
2.0000 | INHALATION_SPRAY | Freq: Two times a day (BID) | RESPIRATORY_TRACT | Status: DC
  Administered 2020-09-11 – 2020-10-04 (×46): 2 via RESPIRATORY_TRACT
  Filled 2020-09-11 (×2): qty 8.8

## 2020-09-11 NOTE — Progress Notes (Addendum)
PROGRESS NOTE  Hannah Torres TDS:[id number] DOB: Feb 10, 1946 DOA: 09/25/2020  PCP: Pcp, No  Brief History/Interval Summary: 74 y.o. female with medical history significant of hypertension, former smoker presented to emergency department with worsening shortness of breath since 4 days.  She is not vaccinated against COVID-19.  Patient was noted to be hypoxic in the emergency department.  Chest x-ray showed bilateral opacities.  Patient was hospitalized for further management.  Reason for Visit: Pneumonia due to COVID-19.  Acute respiratory failure with hypoxia  Consultants: None  Procedures: None  Antibiotics: Anti-infectives (From admission, onward)   Start     Dose/Rate Route Frequency Ordered Stop   09/11/20 1000  remdesivir 100 mg in sodium chloride 0.9 % 100 mL IVPB       "Followed by" Linked Group Details   100 mg 200 mL/hr over 30 Minutes Intravenous Daily 09/27/2020 1815 09/15/20 0959   10/02/2020 2000  remdesivir 200 mg in sodium chloride 0.9% 250 mL IVPB       "Followed by" Linked Group Details   200 mg 580 mL/hr over 30 Minutes Intravenous Once 09/24/2020 1815 10/01/2020 2228   10/04/2020 1530  cefTRIAXone (ROCEPHIN) 2 g in sodium chloride 0.9 % 100 mL IVPB  Status:  Discontinued        2 g 200 mL/hr over 30 Minutes Intravenous Every 24 hours 10/06/2020 1517 09/11/20 0704   10/03/2020 1530  azithromycin (ZITHROMAX) 500 mg in sodium chloride 0.9 % 250 mL IVPB  Status:  Discontinued        500 mg 250 mL/hr over 60 Minutes Intravenous Every 24 hours 10/09/2020 1517 09/11/20 0704      Subjective/Interval History: Patient continues to have some shortness of breath.  Denies any chest pain.  No nausea vomiting.  Occasional cough with whitish expectoration.  Denies any blood in the sputum.  No dizziness or lightheadedness.    Assessment/Plan:  Acute Hypoxic Resp. Failure/Pneumonia due to COVID-19   Recent Labs  Lab 09/28/2020 1335 10/10/2020 1420 09/11/20 0500 09/11/20 0833  DDIMER  3.17*  --  5.10*  --   FERRITIN 1,946*  --   --  1,978*  CRP 18.8*  --   --  20.6*  ALT 52*  --   --   --   PROCALCITON  --  <0.10  --   --     Objective findings: Fever: Noted to be afebrile Oxygen requirements: Currently on high flow nasal cannula at 15 L saturating in the mid 90s.  Should be able to wean down.  Discussed with nursing staff.  Dropped down to 13 L.  COVID 19 Therapeutics: Antibacterials: Was started on ceftriaxone and azithromycin.  Procalcitonin less than 0.1.  Will discontinue. Remdesivir: Day 2 Steroids: Solu-Medrol Diuretics: Not on a scheduled basis Inhaled Steroids: We will add Dulera Baricitinib: Initiated 10/1 after discussions of risks and benefits PUD Prophylaxis: Pepcid DVT Prophylaxis:  Lovenox  From a respiratory standpoint patient is stable but tenuous.  Requiring a lot of oxygen.  Continue Remdesivir baricitinib and steroids.  Will discontinue antibacterial since procalcitonin was less than 0.1.  WBC is normal.  No concern for secondary bacterial infection at this time.  Patient's D-dimer is noted to be elevated.  To be expected with COVID-19.  Patient denies any chest pain.  Her creatinine was noted to be elevated yesterday.  Waiting on labs to come back from today.  Can hold off on CT angiogram for now.  We will get lower extremity Doppler  studies.  Trend D-dimer.  Increase Lovenox to intermediate dosing protocol.  Consider furosemide in the next 24 hours if there is no improvement.  Patient encouraged to stay in prone position as much as possible.  Incentive spirometer.  Mobilization.  The treatment plan and use of medications and known side effects were discussed with patient/family. Some of the medications used are based on case reports/anecdotal data.  All other medications being used in the management of COVID-19 based on limited study data.  Complete risks and long-term side effects are unknown, however in the best clinical judgment they seem to be of  some benefit.  Patient/family wanted to proceed with treatment options provided.  Essential hypertension Monitor blood pressures closely.  Holding her  Acute kidney injury Baseline kidney function is not known.  BUN and creatinine was noted to be elevated.  She received IV fluids in the ED.  Avoid nephrotoxic agents.  Repeat labs pending from today.  Thrombocytosis Likely reactive due to acute infection.  Transaminitis Secondary to COVID-19.  Continue to trend.  DVT Prophylaxis: We will increase Lovenox to intermediate dosing Code Status: Full code Family Communication: Discussed with the patient Disposition Plan: Hopefully return home when improved  Status is: Inpatient  Remains inpatient appropriate because:IV treatments appropriate due to intensity of illness or inability to take PO and Inpatient level of care appropriate due to severity of illness   Dispo: The patient is from: Home              Anticipated d/c is to: Home              Anticipated d/c date is: 3 days              Patient currently is not medically stable to d/c.     Medications:  Scheduled: . vitamin C  500 mg Oral Daily  . baricitinib  2 mg Oral Daily  . enoxaparin (LOVENOX) injection  40 mg Subcutaneous Q24H  . famotidine  20 mg Oral Daily  . methylPREDNISolone (SOLU-MEDROL) injection  1 mg/kg Intravenous Q12H   Followed by  . [START ON 09/14/2020] predniSONE  50 mg Oral Daily  . zinc sulfate  220 mg Oral Daily   Continuous: . remdesivir 100 mg in NS 100 mL     QIW:LNLGXQJJHERDE, chlorpheniramine-HYDROcodone, guaiFENesin-dextromethorphan, ondansetron **OR** ondansetron (ZOFRAN) IV   Objective:  Vital Signs  Vitals:   09/11/20 0743 09/11/20 0745 09/11/20 0810 09/11/20 0815  BP:  123/71    Pulse:  79 79 74  Resp:  (!) 30 (!) 25 (!) 37  Temp: 97.7 F (36.5 C)     TempSrc: Axillary     SpO2:  95% 98% 100%  Weight:      Height:        Intake/Output Summary (Last 24 hours) at 09/11/2020  1024 Last data filed at 10/04/2020 2228 Gross per 24 hour  Intake 1500 ml  Output --  Net 1500 ml   Filed Weights   10/06/2020 2150 09/24/2020 2155  Weight: 78.5 kg 79.4 kg    General appearance: Awake alert.  In no distress Resp: Noted to be tachypneic without use of accessory muscles.  Coarse breath sounds bilaterally.  Few crackles at the bases.  No wheezing or rhonchi. Cardio: S1-S2 is normal regular.  No S3-S4.  No rubs murmurs or bruit GI: Abdomen is soft.  Nontender nondistended.  Bowel sounds are present normal.  No masses organomegaly Extremities: No edema.  Full range of motion  of lower extremities. Neurologic: Alert and oriented x3.  No focal neurological deficits.    Lab Results:  Data Reviewed: I have personally reviewed following labs and imaging studies  CBC: Recent Labs  Lab 10-03-20 1335 10-03-2020 2244 09/11/20 0500  WBC 10.0 7.7 6.1  NEUTROABS 8.1*  --  4.5  HGB 14.2 14.6 12.7  HCT 40.5 40.5 36.7  MCV 78.2* 77.1* 81.2  PLT 552* 461* 507*    Basic Metabolic Panel: Recent Labs  Lab October 03, 2020 1335 10/03/20 2244  NA 141  --   K 4.2  --   CL 107  --   CO2 17*  --   GLUCOSE 197*  --   BUN 64*  --   CREATININE 1.37* 0.95  CALCIUM 9.1  --     GFR: Estimated Creatinine Clearance: 55 mL/min (by C-G formula based on SCr of 0.95 mg/dL).  Liver Function Tests: Recent Labs  Lab Oct 03, 2020 1335  AST 46*  ALT 52*  ALKPHOS 67  BILITOT 1.3*  PROT 7.7  ALBUMIN 3.1*    Lipid Profile: Recent Labs    Oct 03, 2020 1335  TRIG 153*    Anemia Panel: Recent Labs    2020/10/03 1335 09/11/20 0833  FERRITIN 1,946* 1,978*    Recent Results (from the past 240 hour(s))  Respiratory Panel by RT PCR (Flu A&B, Covid) - Nasopharyngeal Swab     Status: Abnormal   Collection Time: 10-03-20  1:36 PM   Specimen: Nasopharyngeal Swab  Result Value Ref Range Status   SARS Coronavirus 2 by RT PCR POSITIVE (A) NEGATIVE Final    Comment: RESULT CALLED TO, READ BACK BY AND  VERIFIED WITH: J,HAVILAND MD @1625  2020-10-03 EB (NOTE) SARS-CoV-2 target nucleic acids are DETECTED.  SARS-CoV-2 RNA is generally detectable in upper respiratory specimens  during the acute phase of infection. Positive results are indicative of the presence of the identified virus, but do not rule out bacterial infection or co-infection with other pathogens not detected by the test. Clinical correlation with patient history and other diagnostic information is necessary to determine patient infection status. The expected result is Negative.  Fact Sheet for Patients:  11/10/20  Fact Sheet for Healthcare Providers: https://www.moore.com/  This test is not yet approved or cleared by the https://www.young.biz/ FDA and  has been authorized for detection and/or diagnosis of SARS-CoV-2 by FDA under an Emergency Use Authorization (EUA).  This EUA will remain in effect (meaning this test can be used ) for the duration of  the COVID-19 declaration under Section 564(b)(1) of the Act, 21 U.S.C. section 360bbb-3(b)(1), unless the authorization is terminated or revoked sooner.      Influenza A by PCR NEGATIVE NEGATIVE Final   Influenza B by PCR NEGATIVE NEGATIVE Final    Comment: (NOTE) The Xpert Xpress SARS-CoV-2/FLU/RSV assay is intended as an aid in  the diagnosis of influenza from Nasopharyngeal swab specimens and  should not be used as a sole basis for treatment. Nasal washings and  aspirates are unacceptable for Xpert Xpress SARS-CoV-2/FLU/RSV  testing.  Fact Sheet for Patients: [place]  Fact Sheet for Healthcare Providers: https://www.moore.com/  This test is not yet approved or cleared by the https://www.young.biz/ FDA and  has been authorized for detection and/or diagnosis of SARS-CoV-2 by  FDA under an Emergency Use Authorization (EUA). This EUA will remain  in effect (meaning this test can  be used) for the duration of the  Covid-19 declaration under Section 564(b)(1) of the Act, 21  U.S.C.  section 360bbb-3(b)(1), unless the authorization is  terminated or revoked. Performed at [facility], [address] [place], [place] [address]   Blood Culture (routine x 2)     Status: None (Preliminary result)   Collection Time: 10/04/2020  1:57 PM   Specimen: BLOOD  Result Value Ref Range Status   Specimen Description BLOOD SITE NOT SPECIFIED  Final   Special Requests   Final    BOTTLES DRAWN AEROBIC AND ANAEROBIC Blood Culture results may not be optimal due to an inadequate volume of blood received in culture bottles   Culture   Final    NO GROWTH < 24 HOURS Performed at [facility], [address] [place], [place] [address]    Report Status PENDING  Incomplete      Radiology Studies: DG Chest Port 1 View  Result Date: 10/04/2020 CLINICAL DATA:  Shortness of breath for 4 days. Cough. Fever. Tachycardia. EXAM: PORTABLE CHEST 1 VIEW COMPARISON:  None. FINDINGS: Midline trachea. Normal heart size. No pleural effusion or pneumothorax. Lower lung predominant interstitial opacities, slightly greater on the right than left. Numerous leads and wires project over the chest. IMPRESSION: Right greater than left lower lung predominant interstitial opacities. Differential considerations include atypical infection (including COVID-19 pneumonia) or an atypical appearance of pulmonary edema (given lack of pleural fluid). Electronically Signed   By: Jeronimo GreavesKyle  Talbot M.D.   On: 10/07/2020 14:06       LOS: 1 day   Wells Fargookul Emilyanne Mcgough  [facility] Pager on [email or web address]  09/11/2020, 10:24 AM

## 2020-09-11 NOTE — ED Notes (Signed)
Armando Reichert, daughter, [phone] would like an update when available

## 2020-09-11 NOTE — ED Notes (Signed)
Pt's family member updated on pt's status and plan of care.

## 2020-09-12 ENCOUNTER — Inpatient Hospital Stay (HOSPITAL_COMMUNITY): Payer: Medicare PPO

## 2020-09-12 DIAGNOSIS — I4891 Unspecified atrial fibrillation: Secondary | ICD-10-CM

## 2020-09-12 DIAGNOSIS — I361 Nonrheumatic tricuspid (valve) insufficiency: Secondary | ICD-10-CM

## 2020-09-12 DIAGNOSIS — I351 Nonrheumatic aortic (valve) insufficiency: Secondary | ICD-10-CM

## 2020-09-12 DIAGNOSIS — J9601 Acute respiratory failure with hypoxia: Secondary | ICD-10-CM | POA: Diagnosis not present

## 2020-09-12 DIAGNOSIS — U071 COVID-19: Secondary | ICD-10-CM

## 2020-09-12 DIAGNOSIS — R7989 Other specified abnormal findings of blood chemistry: Secondary | ICD-10-CM

## 2020-09-12 DIAGNOSIS — N179 Acute kidney failure, unspecified: Secondary | ICD-10-CM | POA: Diagnosis not present

## 2020-09-12 LAB — COMPREHENSIVE METABOLIC PANEL
ALT: 46 U/L — ABNORMAL HIGH (ref 0–44)
AST: 43 U/L — ABNORMAL HIGH (ref 15–41)
Albumin: 2.6 g/dL — ABNORMAL LOW (ref 3.5–5.0)
Alkaline Phosphatase: 61 U/L (ref 38–126)
Anion gap: 13 (ref 5–15)
BUN: 30 mg/dL — ABNORMAL HIGH (ref 8–23)
CO2: 19 mmol/L — ABNORMAL LOW (ref 22–32)
Calcium: 9.6 mg/dL (ref 8.9–10.3)
Chloride: 112 mmol/L — ABNORMAL HIGH (ref 98–111)
Creatinine, Ser: 0.9 mg/dL (ref 0.44–1.00)
GFR calc Af Amer: >60 mL/min (ref >60)
GFR calc non Af Amer: >60 mL/min (ref >60)
Glucose, Bld: 225 mg/dL — ABNORMAL HIGH (ref 70–99)
Potassium: 4.4 mmol/L (ref 3.5–5.1)
Sodium: 144 mmol/L (ref 135–145)
Total Bilirubin: 0.8 mg/dL (ref 0.3–1.2)
Total Protein: 6.8 g/dL (ref 6.5–8.1)

## 2020-09-12 LAB — CBC WITH DIFFERENTIAL/PLATELET
Abs Immature Granulocytes: 0.66 10*3/uL — ABNORMAL HIGH (ref 0.00–0.07)
Basophils Absolute: 0.1 10*3/uL (ref 0.0–0.1)
Basophils Relative: 1 %
Eosinophils Absolute: 0 10*3/uL (ref 0.0–0.5)
Eosinophils Relative: 0 %
HCT: 34.9 % — ABNORMAL LOW (ref 36.0–46.0)
Hemoglobin: 12.6 g/dL (ref 12.0–15.0)
Immature Granulocytes: 5 %
Lymphocytes Relative: 13 %
Lymphs Abs: 1.6 10*3/uL (ref 0.7–4.0)
MCH: 28.1 pg (ref 26.0–34.0)
MCHC: 36.1 g/dL — ABNORMAL HIGH (ref 30.0–36.0)
MCV: 77.7 fL — ABNORMAL LOW (ref 80.0–100.0)
Monocytes Absolute: 0.8 10*3/uL (ref 0.1–1.0)
Monocytes Relative: 6 %
Neutro Abs: 9.7 10*3/uL — ABNORMAL HIGH (ref 1.7–7.7)
Neutrophils Relative %: 75 %
Platelets: 433 10*3/uL — ABNORMAL HIGH (ref 150–400)
RBC: 4.49 MIL/uL (ref 3.87–5.11)
RDW: 12.4 % (ref 11.5–15.5)
WBC: 12.8 10*3/uL — ABNORMAL HIGH (ref 4.0–10.5)
nRBC: 0 % (ref 0.0–0.2)

## 2020-09-12 LAB — ECHOCARDIOGRAM COMPLETE
Height: 65 in
S' Lateral: 2.6 cm
Single Plane A4C EF: 57.2 %
Weight: 2686.4 oz

## 2020-09-12 LAB — MAGNESIUM: Magnesium: 2.2 mg/dL (ref 1.7–2.4)

## 2020-09-12 LAB — HEPARIN LEVEL (UNFRACTIONATED): Heparin Unfractionated: 0.41 IU/mL (ref 0.30–0.70)

## 2020-09-12 LAB — D-DIMER, QUANTITATIVE: D-Dimer, Quant: >20 ug/mL-FEU — ABNORMAL HIGH (ref 0.00–0.50)

## 2020-09-12 LAB — C-REACTIVE PROTEIN: CRP: 13 mg/dL — ABNORMAL HIGH (ref <1.0)

## 2020-09-12 MED ORDER — METOPROLOL TARTRATE 5 MG/5ML IV SOLN
5.0000 mg | Freq: Once | INTRAVENOUS | Status: AC
  Administered 2020-09-12: 5 mg via INTRAVENOUS
  Filled 2020-09-12: qty 5

## 2020-09-12 MED ORDER — HEPARIN (PORCINE) 25000 UT/250ML-% IV SOLN
1050.0000 [IU]/h | INTRAVENOUS | Status: DC
  Administered 2020-09-12: 1100 [IU]/h via INTRAVENOUS
  Administered 2020-09-13: 1050 [IU]/h via INTRAVENOUS
  Filled 2020-09-12 (×2): qty 250

## 2020-09-12 MED ORDER — METOPROLOL TARTRATE 5 MG/5ML IV SOLN
2.5000 mg | Freq: Four times a day (QID) | INTRAVENOUS | Status: DC | PRN
  Administered 2020-09-12 – 2020-09-14 (×3): 2.5 mg via INTRAVENOUS
  Filled 2020-09-12 (×3): qty 5

## 2020-09-12 MED ORDER — DILTIAZEM HCL 25 MG/5ML IV SOLN
10.0000 mg | Freq: Once | INTRAVENOUS | Status: AC
  Administered 2020-09-12: 10 mg via INTRAVENOUS
  Filled 2020-09-12: qty 5

## 2020-09-12 MED ORDER — METOPROLOL TARTRATE 25 MG PO TABS
25.0000 mg | ORAL_TABLET | Freq: Two times a day (BID) | ORAL | Status: DC
  Administered 2020-09-12 – 2020-09-13 (×3): 25 mg via ORAL
  Filled 2020-09-12 (×2): qty 1

## 2020-09-12 NOTE — Progress Notes (Addendum)
   09/12/20 0800  Vitals  BP (!) 162/77  MAP (mmHg) 99  BP Location Right Arm  BP Method Automatic  Patient Position (if appropriate) Lying  Pulse Rate (!) 120  Pulse Rate Source Monitor  ECG Heart Rate (!) 120  Resp (!) 28  Level of Consciousness  Level of Consciousness Alert  MEWS COLOR  MEWS Score Color Red  Oxygen Therapy  SpO2 94 %  O2 Device Non-rebreather Mask  O2 Flow Rate (L/min) 15 L/min  Patient Activity (if Appropriate) In bed  Pulse Oximetry Type Continuous  Pain Assessment  Pain Scale 0-10  Pain Score 0  PCA/Epidural/Spinal Assessment  Respiratory Pattern Regular;Tachypnea;Symmetrical;Dyspnea with exertion  ECG Monitoring  Cardiac Rhythm Atrial fibrillation  New onset of dysrhythmia? Yes  Glasgow Coma Scale  Eye Opening 4  Best Verbal Response (NON-intubated) 5  Best Motor Response 6  Glasgow Coma Scale Score 15  MEWS Score  MEWS Temp 0  MEWS Systolic 0  MEWS Pulse 2  MEWS RR 2  MEWS LOC 0  MEWS Score 4  Provider Notification  Provider Name/Title Osvaldo Shipper, MD  Date Provider Notified 09/12/20  Time Provider Notified [phone]  Notification Type Face-to-face  Notification Reason Change in status  Response See new orders  Date of Provider Response 09/12/20  Time of Provider Response 0840   MD notified of patient's status when in to assess patient.  NO obtained.  EKG ordered.

## 2020-09-12 NOTE — Progress Notes (Signed)
ANTICOAGULATION CONSULT NOTE - Initial Consult  Pharmacy Consult for heparin dosing Indication: atrial fibrillation  No Known Allergies  Patient Measurements: Height: 5\' 5"  (165.1 cm) Weight: 76.2 kg (167 lb 14.4 oz) IBW/kg (Calculated) : 57 Heparin Dosing Weight: 72.7 kg  Vital Signs: Temp: 98.5 F (36.9 C) (10/03 0736) Temp Source: Oral (10/03 0736) BP: 162/77 (10/03 0800) Pulse Rate: 120 (10/03 0800)  Labs: Recent Labs    09/12/2020 1335 09/12/2020 1335 09/29/2020 2244 09/28/2020 2244 09/11/20 0500 09/12/20 0317  HGB 14.2   < > 14.6   < > 12.7 12.6  HCT 40.5   < > 40.5  --  36.7 34.9*  PLT 552*   < > 461*  --  507* 433*  CREATININE 1.37*  --  0.95  --   --  0.90  TROPONINIHS  --   --  12  --   --   --    < > = values in this interval not displayed.    Estimated Creatinine Clearance: 56.9 mL/min (by C-G formula based on SCr of 0.9 mg/dL).   Medical History: Past Medical History:  Diagnosis Date  . HTN (hypertension)   . Sickle cell trait (HCC)   . Tobacco abuse     Medications:  Previously on Lovenox 40 mg BID (last dose 10/2 @2130 ) No AC PTA  Assessment: 74 y.o. F admitted with COVID PNA. Pharmacy has been consulted to dose IV heparin for afib. Patient previously taking Lovenox 40 mg BID, no AC PTA. D-dimer now elevated >20. Hgb 12.6, Hct 34.9, Plt 433.    Goal of Therapy:  Heparin level 0.3-0.7 units/ml Monitor platelets by anticoagulation protocol: Yes   Plan:  Start heparin infusion at 1100 units/hr Check anti-Xa level in 6 hours and daily while on heparin Continue to monitor H&H and platelets  , PharmD PGY1 Acute Care Pharmacy Resident 09/12/2020 8:57 AM  Please check [email or web address] for unit-specific pharmacy phone numbers.

## 2020-09-12 NOTE — Progress Notes (Signed)
  Echocardiogram 2D Echocardiogram has been performed.  Hannah Torres F 09/12/2020, 1:33 PM

## 2020-09-12 NOTE — Progress Notes (Signed)
VASCULAR LAB    Bilateral lower extremity venous duplex has been performed.  See CV proc for preliminary results.  Messaged Dr. Rito Ehrlich with results.  Sherlyn Ebbert, RVT 09/12/2020, 4:25 PM

## 2020-09-12 NOTE — Progress Notes (Signed)
PROGRESS NOTE  Hannah Torres RWE:[id number] DOB: July 13, 1946 DOA: Sep 17, 2020  PCP: Pcp, No  Brief History/Interval Summary: 74 y.o. female with medical history significant of hypertension, former smoker presented to emergency department with worsening shortness of breath since 4 days.  She is not vaccinated against COVID-19.  Patient was noted to be hypoxic in the emergency department.  Chest x-ray showed bilateral opacities.  Patient was hospitalized for further management.  Reason for Visit: Pneumonia due to COVID-19.  Acute respiratory failure with hypoxia  Consultants: None  Procedures: None  Antibiotics: Anti-infectives (From admission, onward)   Start     Dose/Rate Route Frequency Ordered Stop   09/11/20 1000  remdesivir 100 mg in sodium chloride 0.9 % 100 mL IVPB       "Followed by" Linked Group Details   100 mg 200 mL/hr over 30 Minutes Intravenous Daily September 17, 2020 1815 09/15/20 0959   17-Sep-2020 2000  remdesivir 200 mg in sodium chloride 0.9% 250 mL IVPB       "Followed by" Linked Group Details   200 mg 580 mL/hr over 30 Minutes Intravenous Once 2020-09-17 1815 09/17/20 2228   Sep 17, 2020 1530  cefTRIAXone (ROCEPHIN) 2 g in sodium chloride 0.9 % 100 mL IVPB  Status:  Discontinued        2 g 200 mL/hr over 30 Minutes Intravenous Every 24 hours Sep 17, 2020 1517 09/11/20 0704   September 17, 2020 1530  azithromycin (ZITHROMAX) 500 mg in sodium chloride 0.9 % 250 mL IVPB  Status:  Discontinued        500 mg 250 mL/hr over 60 Minutes Intravenous Every 24 hours September 17, 2020 1517 09/11/20 0704      Subjective/Interval History: Patient denies any chest pain palpitations.  Shortness of breath is improving.  Occasional dry cough.  No dizziness or lightheadedness.    Assessment/Plan:  Acute Hypoxic Resp. Failure/Pneumonia due to COVID-19   Recent Labs  Lab 09/17/20 1335 09/17/2020 1420 09/11/20 0500 09/11/20 0833 09/12/20 0317  DDIMER 3.17*  --  5.10*  --  >20.00*  FERRITIN 1,946*  --   --   1,978*  --   CRP 18.8*  --   --  20.6* 13.0*  ALT 52*  --   --   --  46*  PROCALCITON  --  <0.10  --   --   --     Objective findings: Oxygen requirements: Noted to be on high flow nasal cannula 8 to 10 L/min.  Saturating in the early 90s.    COVID 19 Therapeutics: Antibacterials: Antibacterials were discontinued. Remdesivir: Day 3 Steroids: Solu-Medrol Diuretics: Not given yet Inhaled Steroids:  Dulera Baricitinib: Initiated 10/1 after discussions of risks and benefits PUD Prophylaxis: Pepcid DVT Prophylaxis:  Lovenox changed over to IV heparin due to A. fib  From a respiratory standpoint patient remained stable.  She seems to be on less amount of oxygen today compared to yesterday.  Denies any complaints.  Noted to be in rapid atrial fibrillation.  See discussion below.    Continue with Remdesivir steroids and baricitinib.  Patient's a D-dimer was noted to be significantly elevated at greater than 20.  We were planning to do a CT angiogram but will need to hold off since the patient is tachycardic with A. fib.  Once once heart rate has improved we can attempt a CT angiogram.  CRP noted to be better at 13.0.  Patient encouraged to stay in prone position as much as possible.  Incentive spirometer.  Mobilization.  The treatment plan  and use of medications and known side effects were discussed with patient/family. Some of the medications used are based on case reports/anecdotal data.  All other medications being used in the management of COVID-19 based on limited study data.  Complete risks and long-term side effects are unknown, however in the best clinical judgment they seem to be of some benefit.  Patient/family wanted to proceed with treatment options provided.  Atrial fibrillation with RVR No previous history of same.  Noted on telemetry.  EKG has been ordered.  Echocardiogram TSH.  Cardizem x1 was not very effective.  Will give metoprolol x1.  If she does not convert and if she  remains tachycardic then we will start her on Cardizem infusion.  Otherwise we can attempt oral Cardizem as well.  A. fib likely triggered by her acute respiratory illness.  Essential hypertension Blood pressure reasonably well controlled.  Occasional high readings noted.  Holding her antihypertensives.  Acute kidney injury Baseline kidney function is not known.  BUN and creatinine was noted to be elevated.  She was given IV fluids.  Renal function noted to be better today.  Avoid nephrotoxic agents.  Monitor urine output.  Thrombocytosis/leukocytosis Likely reactive due to acute infection.  Elevated WBC likely due to steroids.  Transaminitis Secondary to COVID-19.  Continue to trend.  DVT Prophylaxis: IV heparin as discussed above Code Status: Full code Family Communication: Daughter will be updated later today. Disposition Plan: Hopefully return home when improved  Status is: Inpatient  Remains inpatient appropriate because:IV treatments appropriate due to intensity of illness or inability to take PO and Inpatient level of care appropriate due to severity of illness   Dispo: The patient is from: Home              Anticipated d/c is to: Home              Anticipated d/c date is: 3 days              Patient currently is not medically stable to d/c.     Medications:  Scheduled: . vitamin C  500 mg Oral Daily  . baricitinib  2 mg Oral Daily  . famotidine  20 mg Oral Daily  . influenza vaccine adjuvanted  0.5 mL Intramuscular Tomorrow-1000  . methylPREDNISolone (SOLU-MEDROL) injection  80 mg Intravenous Q12H  . metoprolol tartrate  5 mg Intravenous Once  . mometasone-formoterol  2 puff Inhalation BID  . zinc sulfate  220 mg Oral Daily   Continuous: . heparin 1,100 Units/hr (09/12/20 0948)  . remdesivir 100 mg in NS 100 mL 100 mg (09/12/20 1035)   MVH:QIONGEXBMWUXLPRN:acetaminophen, chlorpheniramine-HYDROcodone, guaiFENesin-dextromethorphan, ondansetron **OR** ondansetron (ZOFRAN)  IV   Objective:  Vital Signs  Vitals:   09/12/20 0312 09/12/20 0736 09/12/20 0800 09/12/20 0849  BP: (!) 155/75 (!) 145/79 (!) 162/77   Pulse: 84 (!) 126 (!) 120   Resp:  (!) 24 (!) 46 (!) 28  Temp: 98.4 F (36.9 C) 98.5 F (36.9 C)    TempSrc: Axillary Oral    SpO2: 100% 100% 94% 100%  Weight:      Height:        Intake/Output Summary (Last 24 hours) at 09/12/2020 1035 Last data filed at 09/11/2020 1418 Gross per 24 hour  Intake 100 ml  Output --  Net 100 ml   Filed Weights   10/04/2020 2150 10/07/2020 2155 09/11/20 1649  Weight: 78.5 kg 79.4 kg 76.2 kg    General appearance: Awake alert.  In no distress Resp: Tachypneic without use of accessory muscles.  Coarse breath sounds with crackles bilateral bases.  No wheezing or rhonchi. Cardio: S2 is irregularly irregular tachycardic.  Telemetry shows atrial fibrillation with RVR GI: Abdomen is soft.  Nontender nondistended.  Bowel sounds are present normal.  No masses organomegaly Extremities: No edema.  Full range of motion of lower extremities. Neurologic: Alert and oriented x3.  No focal neurological deficits.     Lab Results:  Data Reviewed: I have personally reviewed following labs and imaging studies  CBC: Recent Labs  Lab 10/03/2020 1335 09/23/2020 2244 09/11/20 0500 09/12/20 0317  WBC 10.0 7.7 6.1 12.8*  NEUTROABS 8.1*  --  4.5 9.7*  HGB 14.2 14.6 12.7 12.6  HCT 40.5 40.5 36.7 34.9*  MCV 78.2* 77.1* 81.2 77.7*  PLT 552* 461* 507* 433*    Basic Metabolic Panel: Recent Labs  Lab 09/23/2020 1335 09/11/2020 2244 09/12/20 0317  NA 141  --  144  K 4.2  --  4.4  CL 107  --  112*  CO2 17*  --  19*  GLUCOSE 197*  --  225*  BUN 64*  --  30*  CREATININE 1.37* 0.95 0.90  CALCIUM 9.1  --  9.6  MG  --   --  2.2    GFR: Estimated Creatinine Clearance: 56.9 mL/min (by C-G formula based on SCr of 0.9 mg/dL).  Liver Function Tests: Recent Labs  Lab 09/14/2020 1335 09/12/20 0317  AST 46* 43*  ALT 52* 46*   ALKPHOS 67 61  BILITOT 1.3* 0.8  PROT 7.7 6.8  ALBUMIN 3.1* 2.6*    Lipid Profile: Recent Labs    09/26/2020 1335  TRIG 153*    Anemia Panel: Recent Labs    10/04/2020 1335 09/11/20 0833  FERRITIN 1,946* 1,978*    Recent Results (from the past 240 hour(s))  Respiratory Panel by RT PCR (Flu A&B, Covid) - Nasopharyngeal Swab     Status: Abnormal   Collection Time: 09/13/2020  1:36 PM   Specimen: Nasopharyngeal Swab  Result Value Ref Range Status   SARS Coronavirus 2 by RT PCR POSITIVE (A) NEGATIVE Final    Comment: RESULT CALLED TO, READ BACK BY AND VERIFIED WITH: J,HAVILAND MD @1625  09/20/2020 EB (NOTE) SARS-CoV-2 target nucleic acids are DETECTED.  SARS-CoV-2 RNA is generally detectable in upper respiratory specimens  during the acute phase of infection. Positive results are indicative of the presence of the identified virus, but do not rule out bacterial infection or co-infection with other pathogens not detected by the test. Clinical correlation with patient history and other diagnostic information is necessary to determine patient infection status. The expected result is Negative.  Fact Sheet for Patients:  11/10/20  Fact Sheet for Healthcare Providers: https://www.moore.com/  This test is not yet approved or cleared by the https://www.young.biz/ FDA and  has been authorized for detection and/or diagnosis of SARS-CoV-2 by FDA under an Emergency Use Authorization (EUA).  This EUA will remain in effect (meaning this test can be used ) for the duration of  the COVID-19 declaration under Section 564(b)(1) of the Act, 21 U.S.C. section 360bbb-3(b)(1), unless the authorization is terminated or revoked sooner.      Influenza A by PCR NEGATIVE NEGATIVE Final   Influenza B by PCR NEGATIVE NEGATIVE Final    Comment: (NOTE) The Xpert Xpress SARS-CoV-2/FLU/RSV assay is intended as an aid in  the diagnosis of influenza from  Nasopharyngeal swab specimens and  should not be  used as a sole basis for treatment. Nasal washings and  aspirates are unacceptable for Xpert Xpress SARS-CoV-2/FLU/RSV  testing.  Fact Sheet for Patients: https://www.moore.com/  Fact Sheet for Healthcare Providers: https://www.young.biz/  This test is not yet approved or cleared by the [place] FDA and  has been authorized for detection and/or diagnosis of SARS-CoV-2 by  FDA under an Emergency Use Authorization (EUA). This EUA will remain  in effect (meaning this test can be used) for the duration of the  Covid-19 declaration under Section 564(b)(1) of the Act, 21  U.S.C. section 360bbb-3(b)(1), unless the authorization is  terminated or revoked. Performed at [facility], [address], [place], [place] [address]   Blood Culture (routine x 2)     Status: None (Preliminary result)   Collection Time: 10/10/2020  1:57 PM   Specimen: BLOOD  Result Value Ref Range Status   Specimen Description BLOOD SITE NOT SPECIFIED  Final   Special Requests   Final    BOTTLES DRAWN AEROBIC AND ANAEROBIC Blood Culture results may not be optimal due to an inadequate volume of blood received in culture bottles   Culture   Final    NO GROWTH < 24 HOURS Performed at [facility], [address], [place], [place] [address]    Report Status PENDING  Incomplete      Radiology Studies: DG Chest Port 1 View  Result Date: 09/24/2020 CLINICAL DATA:  Shortness of breath for 4 days. Cough. Fever. Tachycardia. EXAM: PORTABLE CHEST 1 VIEW COMPARISON:  None. FINDINGS: Midline trachea. Normal heart size. No pleural effusion or pneumothorax. Lower lung predominant interstitial opacities, slightly greater on the right than left. Numerous leads and wires project over the chest. IMPRESSION: Right greater than left lower lung predominant interstitial opacities. Differential considerations include atypical infection  (including COVID-19 pneumonia) or an atypical appearance of pulmonary edema (given lack of pleural fluid). Electronically Signed   By: Jeronimo Greaves M.D.   On: 09/23/2020 14:06       LOS: 2 days   Nyal Schachter Rito Ehrlich  [facility] Pager on [email or web address]  09/12/2020, 10:35 AM

## 2020-09-12 NOTE — Progress Notes (Addendum)
ANTICOAGULATION CONSULT NOTE - Follow Up Consult  Pharmacy Consult for heparin dosing Indication: atrial fibrillation/ DVT  Medications:  Previously on Lovenox 40 mg BID (last dose 10/2 @2130 ) No AC PTA  Assessment: 74 y.o. F admitted with COVID PNA. Pharmacy has been consulted to dose IV heparin for afib. Now with documented acute bilateral lower extremity DVTs.   Heparin level now 0.70 units/ml  Goal of Therapy:  Heparin level 0.3-0.7 units/ml Monitor platelets by anticoagulation protocol: Yes   Plan:  Decrease heparin infusion to 1050 units/hr Check confirmatory anti-Xa level in 8 hours   Continue to monitor H&H and platelets  Thanks for allowing pharmacy to be a part of this patient's care.  66, PharmD Clinical Pharmacist

## 2020-09-13 DIAGNOSIS — U071 COVID-19: Secondary | ICD-10-CM | POA: Diagnosis not present

## 2020-09-13 DIAGNOSIS — J9601 Acute respiratory failure with hypoxia: Secondary | ICD-10-CM | POA: Diagnosis not present

## 2020-09-13 DIAGNOSIS — I82403 Acute embolism and thrombosis of unspecified deep veins of lower extremity, bilateral: Secondary | ICD-10-CM

## 2020-09-13 DIAGNOSIS — I4891 Unspecified atrial fibrillation: Secondary | ICD-10-CM | POA: Diagnosis not present

## 2020-09-13 LAB — C-REACTIVE PROTEIN: CRP: 5.8 mg/dL — ABNORMAL HIGH (ref <1.0)

## 2020-09-13 LAB — COMPREHENSIVE METABOLIC PANEL
ALT: 36 U/L (ref 0–44)
AST: 30 U/L (ref 15–41)
Albumin: 2.2 g/dL — ABNORMAL LOW (ref 3.5–5.0)
Alkaline Phosphatase: 70 U/L (ref 38–126)
Anion gap: 13 (ref 5–15)
BUN: 28 mg/dL — ABNORMAL HIGH (ref 8–23)
CO2: 18 mmol/L — ABNORMAL LOW (ref 22–32)
Calcium: 9.5 mg/dL (ref 8.9–10.3)
Chloride: 115 mmol/L — ABNORMAL HIGH (ref 98–111)
Creatinine, Ser: 0.94 mg/dL (ref 0.44–1.00)
GFR calc Af Amer: >60 mL/min (ref >60)
GFR calc non Af Amer: >60 mL/min (ref >60)
Glucose, Bld: 200 mg/dL — ABNORMAL HIGH (ref 70–99)
Potassium: 4.7 mmol/L (ref 3.5–5.1)
Sodium: 146 mmol/L — ABNORMAL HIGH (ref 135–145)
Total Bilirubin: 0.6 mg/dL (ref 0.3–1.2)
Total Protein: 6.4 g/dL — ABNORMAL LOW (ref 6.5–8.1)

## 2020-09-13 LAB — GLUCOSE, CAPILLARY
Glucose-Capillary: 251 mg/dL — ABNORMAL HIGH (ref 70–99)
Glucose-Capillary: 243 mg/dL — ABNORMAL HIGH (ref 70–99)

## 2020-09-13 LAB — CBC WITH DIFFERENTIAL/PLATELET
Abs Immature Granulocytes: 0.78 10*3/uL — ABNORMAL HIGH (ref 0.00–0.07)
Basophils Absolute: 0.1 10*3/uL (ref 0.0–0.1)
Basophils Relative: 0 %
Eosinophils Absolute: 0 10*3/uL (ref 0.0–0.5)
Eosinophils Relative: 0 %
HCT: 37.4 % (ref 36.0–46.0)
Hemoglobin: 13.7 g/dL (ref 12.0–15.0)
Immature Granulocytes: 4 %
Lymphocytes Relative: 7 %
Lymphs Abs: 1.3 10*3/uL (ref 0.7–4.0)
MCH: 28.5 pg (ref 26.0–34.0)
MCHC: 36.6 g/dL — ABNORMAL HIGH (ref 30.0–36.0)
MCV: 77.9 fL — ABNORMAL LOW (ref 80.0–100.0)
Monocytes Absolute: 0.8 10*3/uL (ref 0.1–1.0)
Monocytes Relative: 5 %
Neutro Abs: 15 10*3/uL — ABNORMAL HIGH (ref 1.7–7.7)
Neutrophils Relative %: 84 %
Platelets: 483 10*3/uL — ABNORMAL HIGH (ref 150–400)
RBC: 4.8 MIL/uL (ref 3.87–5.11)
RDW: 12.5 % (ref 11.5–15.5)
WBC: 18 10*3/uL — ABNORMAL HIGH (ref 4.0–10.5)
nRBC: 0 % (ref 0.0–0.2)

## 2020-09-13 LAB — HEPARIN LEVEL (UNFRACTIONATED)
Heparin Unfractionated: 0.7 IU/mL (ref 0.30–0.70)
Heparin Unfractionated: 0.8 IU/mL — ABNORMAL HIGH (ref 0.30–0.70)

## 2020-09-13 LAB — TSH: TSH: 0.157 u[IU]/mL — ABNORMAL LOW (ref 0.350–4.500)

## 2020-09-13 LAB — MAGNESIUM: Magnesium: 2.2 mg/dL (ref 1.7–2.4)

## 2020-09-13 LAB — D-DIMER, QUANTITATIVE: D-Dimer, Quant: >20 ug/mL-FEU — ABNORMAL HIGH (ref 0.00–0.50)

## 2020-09-13 MED ORDER — FUROSEMIDE 10 MG/ML IJ SOLN
20.0000 mg | Freq: Once | INTRAMUSCULAR | Status: AC
  Administered 2020-09-13: 20 mg via INTRAVENOUS
  Filled 2020-09-13: qty 2

## 2020-09-13 MED ORDER — INSULIN ASPART 100 UNIT/ML ~~LOC~~ SOLN
0.0000 [IU] | Freq: Three times a day (TID) | SUBCUTANEOUS | Status: DC
  Administered 2020-09-13 – 2020-09-14 (×2): 5 [IU] via SUBCUTANEOUS

## 2020-09-13 MED ORDER — METOPROLOL TARTRATE 50 MG PO TABS
50.0000 mg | ORAL_TABLET | Freq: Two times a day (BID) | ORAL | Status: DC
  Administered 2020-09-13: 50 mg via ORAL
  Filled 2020-09-13: qty 1

## 2020-09-13 MED ORDER — BARICITINIB 2 MG PO TABS
4.0000 mg | ORAL_TABLET | Freq: Every day | ORAL | Status: DC
  Administered 2020-09-14 – 2020-09-17 (×4): 4 mg via ORAL
  Filled 2020-09-13 (×4): qty 2

## 2020-09-13 MED ORDER — METOPROLOL TARTRATE 5 MG/5ML IV SOLN
5.0000 mg | Freq: Once | INTRAVENOUS | Status: AC
  Administered 2020-09-13: 5 mg via INTRAVENOUS
  Filled 2020-09-13: qty 5

## 2020-09-13 MED ORDER — LOPERAMIDE HCL 2 MG PO CAPS
4.0000 mg | ORAL_CAPSULE | Freq: Three times a day (TID) | ORAL | Status: DC | PRN
  Administered 2020-09-13: 4 mg via ORAL
  Filled 2020-09-13 (×2): qty 2

## 2020-09-13 MED ORDER — ENOXAPARIN SODIUM 80 MG/0.8ML ~~LOC~~ SOLN
1.0000 mg/kg | Freq: Two times a day (BID) | SUBCUTANEOUS | Status: AC
  Administered 2020-09-13 – 2020-09-23 (×21): 75 mg via SUBCUTANEOUS
  Filled 2020-09-13 (×21): qty 0.8

## 2020-09-13 NOTE — Progress Notes (Signed)
PROGRESS NOTE  Enrique SackVirginia L Linskey GEX:[id number] DOB: 03/15/1946 DOA: 09/21/2020  PCP: Pcp, No  Brief History/Interval Summary: 74 y.o. female with medical history significant of hypertension, former smoker presented to emergency department with worsening shortness of breath since 4 days.  She is not vaccinated against COVID-19.  Patient was noted to be hypoxic in the emergency department.  Chest x-ray showed bilateral opacities.  Patient was hospitalized for further management.  Reason for Visit: Pneumonia due to COVID-19.  Acute respiratory failure with hypoxia  Consultants: None  Procedures: None  Antibiotics: Anti-infectives (From admission, onward)   Start     Dose/Rate Route Frequency Ordered Stop   09/11/20 1000  remdesivir 100 mg in sodium chloride 0.9 % 100 mL IVPB       "Followed by" Linked Group Details   100 mg 200 mL/hr over 30 Minutes Intravenous Daily 10/04/2020 1815 09/15/20 0959   09/28/2020 2000  remdesivir 200 mg in sodium chloride 0.9% 250 mL IVPB       "Followed by" Linked Group Details   200 mg 580 mL/hr over 30 Minutes Intravenous Once 09/14/2020 1815 10/06/2020 2228   09/18/2020 1530  cefTRIAXone (ROCEPHIN) 2 g in sodium chloride 0.9 % 100 mL IVPB  Status:  Discontinued        2 g 200 mL/hr over 30 Minutes Intravenous Every 24 hours 09/24/2020 1517 09/11/20 0704   10/07/2020 1530  azithromycin (ZITHROMAX) 500 mg in sodium chloride 0.9 % 250 mL IVPB  Status:  Discontinued        500 mg 250 mL/hr over 60 Minutes Intravenous Every 24 hours 09/18/2020 1517 09/11/20 0704      Subjective/Interval History: Patient denies any chest pain.  Shortness of breath is improving.  Continues to have a cough.  No dizziness or lightheadedness.    Assessment/Plan:  Acute Hypoxic Resp. Failure/Pneumonia due to COVID-19   Recent Labs  Lab 09/16/2020 1335 09/29/2020 1420 09/11/20 0500 09/11/20 0833 09/12/20 0317 09/13/20 0411  DDIMER 3.17*  --  5.10*  --  >20.00* >20.00*  FERRITIN  1,946*  --   --  1,978*  --   --   CRP 18.8*  --   --  20.6* 13.0* 5.8*  ALT 52*  --   --   --  46* 36  PROCALCITON  --  <0.10  --   --   --   --     Objective findings: Oxygen requirements: Cannula along with partial rebreather.  Saturating in the mid to late 90s.  Should be able to wean her down.    COVID 19 Therapeutics: Antibacterials: Antibacterials were discontinued. Remdesivir: Day 4 Steroids: Solu-Medrol Diuretics: Lasix x1 today. Inhaled Steroids:  Dulera Baricitinib: Initiated 10/1 after discussions of risks and benefits PUD Prophylaxis: Pepcid DVT Prophylaxis:  on IV heparin due to A. fib  From a respiratory standpoint patient remains stable.  Slight improvement noted.  Continue to wean down oxygen as tolerated.  Her D-dimer remains greater than 20.  CRP improved to 5.8.  Lower extremity Doppler study was positive for DVT.  She likely has acute pulmonary embolism as well.  No need to pursue CT angiogram at this time as it will not change management.  Continue Remdesivir, steroids, baricitinib.  Also remains on Dulera.  Will give 1 dose of furosemide today.  Patient encouraged to stay in prone position as much as possible.  Incentive spirometer.  Mobilization.  The treatment plan and use of medications and known side effects were  discussed with patient/family. Some of the medications used are based on case reports/anecdotal data.  All other medications being used in the management of COVID-19 based on limited study data.  Complete risks and long-term side effects are unknown, however in the best clinical judgment they seem to be of some benefit.  Patient/family wanted to proceed with treatment options provided.  Acute DVT bilateral lower extremities/presumed PE Currently on IV heparin.  Transition to oral anticoagulation when she is more stable.  Atrial fibrillation with RVR No previous history of same.  Heart rate noted to be poorly controlled this morning.  Increase dose  of metoprolol.  TSH noted to be 0.157.  We will check free T4 tomorrow morning.  Echocardiogram shows normal systolic function.  No valvular abnormalities of concern noted. A. fib likely triggered by her acute respiratory illness.  Outpatient cardiology follow-up.  Essential hypertension Continue to monitor blood pressures closely.  Acute kidney injury Baseline kidney function is not known.  BUN and creatinine was noted to be elevated.  She was given IV fluids.  Renal function back to baseline.  Avoid nephrotoxic agents.  Monitor urine output.  Thrombocytosis/leukocytosis Likely reactive due to acute infection.  Elevated WBC likely due to steroids.  Transaminitis Secondary to COVID-19.  Continue to trend.  DVT Prophylaxis: IV heparin as discussed above Code Status: Full code Family Communication: Daughter being updated daily Disposition Plan: Hopefully return home when improved  Status is: Inpatient  Remains inpatient appropriate because:IV treatments appropriate due to intensity of illness or inability to take PO and Inpatient level of care appropriate due to severity of illness   Dispo: The patient is from: Home              Anticipated d/c is to: Home              Anticipated d/c date is: 3 days              Patient currently is not medically stable to d/c.     Medications:  Scheduled:  vitamin C  500 mg Oral Daily   baricitinib  2 mg Oral Daily   famotidine  20 mg Oral Daily   influenza vaccine adjuvanted  0.5 mL Intramuscular Tomorrow-1000   methylPREDNISolone (SOLU-MEDROL) injection  80 mg Intravenous Q12H   metoprolol tartrate  50 mg Oral BID   mometasone-formoterol  2 puff Inhalation BID   zinc sulfate  220 mg Oral Daily   Continuous:  heparin Stopped (09/13/20 1023)   remdesivir 100 mg in NS 100 mL 100 mg (09/13/20 0826)   OIT:GPQDIYMEBRAXE, chlorpheniramine-HYDROcodone, guaiFENesin-dextromethorphan, loperamide, metoprolol tartrate, ondansetron **OR**  ondansetron (ZOFRAN) IV   Objective:  Vital Signs  Vitals:   09/13/20 0756 09/13/20 0800 09/13/20 0835 09/13/20 0852  BP:  (!) 162/87    Pulse:      Resp: (!) 36 20  18  Temp: 97.7 F (36.5 C)     TempSrc: Axillary     SpO2: 97%  100% 92%  Weight:      Height:        Intake/Output Summary (Last 24 hours) at 09/13/2020 1110 Last data filed at 09/13/2020 0305 Gross per 24 hour  Intake 835.06 ml  Output --  Net 835.06 ml   Filed Weights   09-29-2020 2150 Sep 29, 2020 2155 09/11/20 1649  Weight: 78.5 kg 79.4 kg 76.2 kg    General appearance: Awake alert.  In no distress Resp: Mildly tachypneic.  No use of accessory muscles.  Crackles  bilateral bases.  No wheezing or rhonchi.   Cardio: S1-S2 is irregularly irregular.  No S3-S4.  No rubs murmurs or bruit GI: Abdomen is soft.  Nontender nondistended.  Bowel sounds are present normal.  No masses organomegaly Extremities: No edema.  Full range of motion of lower extremities. Neurologic: Alert and oriented x3.  No focal neurological deficits.     Lab Results:  Data Reviewed: I have personally reviewed following labs and imaging studies  CBC: Recent Labs  Lab October 03, 2020 1335 10-03-2020 2244 09/11/20 0500 09/12/20 0317 09/13/20 0411  WBC 10.0 7.7 6.1 12.8* 18.0*  NEUTROABS 8.1*  --  4.5 9.7* 15.0*  HGB 14.2 14.6 12.7 12.6 13.7  HCT 40.5 40.5 36.7 34.9* 37.4  MCV 78.2* 77.1* 81.2 77.7* 77.9*  PLT 552* 461* 507* 433* 483*    Basic Metabolic Panel: Recent Labs  Lab 10/03/2020 1335 2020-10-03 2244 09/12/20 0317 09/13/20 0411  NA 141  --  144 146*  K 4.2  --  4.4 4.7  CL 107  --  112* 115*  CO2 17*  --  19* 18*  GLUCOSE 197*  --  225* 200*  BUN 64*  --  30* 28*  CREATININE 1.37* 0.95 0.90 0.94  CALCIUM 9.1  --  9.6 9.5  MG  --   --  2.2 2.2    GFR: Estimated Creatinine Clearance: 54.4 mL/min (by C-G formula based on SCr of 0.94 mg/dL).  Liver Function Tests: Recent Labs  Lab 2020-10-03 1335 09/12/20 0317  09/13/20 0411  AST 46* 43* 30  ALT 52* 46* 36  ALKPHOS 67 61 70  BILITOT 1.3* 0.8 0.6  PROT 7.7 6.8 6.4*  ALBUMIN 3.1* 2.6* 2.2*    Lipid Profile: Recent Labs    Oct 03, 2020 1335  TRIG 153*    Anemia Panel: Recent Labs    October 03, 2020 1335 09/11/20 0833  FERRITIN 1,946* 1,978*    Recent Results (from the past 240 hour(s))  Respiratory Panel by RT PCR (Flu A&B, Covid) - Nasopharyngeal Swab     Status: Abnormal   Collection Time: 2020/10/03  1:36 PM   Specimen: Nasopharyngeal Swab  Result Value Ref Range Status   SARS Coronavirus 2 by RT PCR POSITIVE (A) NEGATIVE Final    Comment: RESULT CALLED TO, READ BACK BY AND VERIFIED WITH: J,HAVILAND MD @1625  10-03-2020 EB (NOTE) SARS-CoV-2 target nucleic acids are DETECTED.  SARS-CoV-2 RNA is generally detectable in upper respiratory specimens  during the acute phase of infection. Positive results are indicative of the presence of the identified virus, but do not rule out bacterial infection or co-infection with other pathogens not detected by the test. Clinical correlation with patient history and other diagnostic information is necessary to determine patient infection status. The expected result is Negative.  Fact Sheet for Patients:  11/10/20  Fact Sheet for Healthcare Providers: https://www.moore.com/  This test is not yet approved or cleared by the https://www.young.biz/ FDA and  has been authorized for detection and/or diagnosis of SARS-CoV-2 by FDA under an Emergency Use Authorization (EUA).  This EUA will remain in effect (meaning this test can be used ) for the duration of  the COVID-19 declaration under Section 564(b)(1) of the Act, 21 U.S.C. section 360bbb-3(b)(1), unless the authorization is terminated or revoked sooner.      Influenza A by PCR NEGATIVE NEGATIVE Final   Influenza B by PCR NEGATIVE NEGATIVE Final    Comment: (NOTE) The Xpert Xpress SARS-CoV-2/FLU/RSV assay  is intended as an aid in  the diagnosis of influenza from Nasopharyngeal swab specimens and  should not be used as a sole basis for treatment. Nasal washings and  aspirates are unacceptable for Xpert Xpress SARS-CoV-2/FLU/RSV  testing.  Fact Sheet for Patients: https://www.moore.com/  Fact Sheet for Healthcare Providers: https://www.young.biz/  This test is not yet approved or cleared by the [place] FDA and  has been authorized for detection and/or diagnosis of SARS-CoV-2 by  FDA under an Emergency Use Authorization (EUA). This EUA will remain  in effect (meaning this test can be used) for the duration of the  Covid-19 declaration under Section 564(b)(1) of the Act, 21  U.S.C. section 360bbb-3(b)(1), unless the authorization is  terminated or revoked. Performed at [facility], [address], [place], [place] [address]   Blood Culture (routine x 2)     Status: None (Preliminary result)   Collection Time: 10/02/20  1:57 PM   Specimen: BLOOD  Result Value Ref Range Status   Specimen Description BLOOD SITE NOT SPECIFIED  Final   Special Requests   Final    BOTTLES DRAWN AEROBIC AND ANAEROBIC Blood Culture results may not be optimal due to an inadequate volume of blood received in culture bottles   Culture   Final    NO GROWTH 2 DAYS Performed at [facility], [address] [place], [place] [address]    Report Status PENDING  Incomplete      Radiology Studies: ECHOCARDIOGRAM COMPLETE  Result Date: 09/12/2020    ECHOCARDIOGRAM REPORT   Patient Name:   Hannah Torres Date of Exam: 09/12/2020 Medical Rec #:  [id number]         Height:       65.0 in Accession #:    [id number]        Weight:       167.9 lb Date of Birth:  1946/12/11          BSA:          1.836 m Patient Age:    74 years          BP:           136/116 mmHg Patient Gender: F                 HR:           108 bpm. Exam Location:  Inpatient Procedure: 2D Echo,  Cardiac Doppler and Color Doppler Indications:    I48.91* Unspeicified atrial fibrillation  History:        Patient has no prior history of Echocardiogram examinations.                 Covid19 positive.  Sonographer:    Roosvelt Maser RDCS Referring Phys: [id number] [facility] IMPRESSIONS  1. Left ventricular ejection fraction, by estimation, is 60 to 65%. The left ventricle has normal function. The left ventricle has no regional wall motion abnormalities. Left ventricular diastolic function could not be evaluated.  2. Right ventricular systolic function is normal. The right ventricular size is normal.  3. The mitral valve is normal in structure. Trivial mitral valve regurgitation. No evidence of mitral stenosis.  4. Tricuspid valve regurgitation is mild to moderate.  5. The aortic valve is tricuspid. Aortic valve regurgitation is mild. Mild to moderate aortic valve sclerosis/calcification is present, without any evidence of aortic stenosis.  6. The inferior vena cava is normal in size with greater than 50% respiratory variability, suggesting right atrial pressure of 3 mmHg.  FINDINGS  Left Ventricle: Left ventricular ejection fraction, by estimation, is 60 to 65%. The left ventricle has normal function. The left ventricle has no regional wall motion abnormalities. The left ventricular internal cavity size was normal in size. There is  no left ventricular hypertrophy. Left ventricular diastolic function could not be evaluated due to atrial fibrillation. Left ventricular diastolic function could not be evaluated. Right Ventricle: The right ventricular size is normal. No increase in right ventricular wall thickness. Right ventricular systolic function is normal. Left Atrium: Left atrial size was normal in size. Right Atrium: Right atrial size was normal in size. Pericardium: There is no evidence of pericardial effusion. Mitral Valve: The mitral valve is normal in structure. Trivial mitral valve regurgitation. No evidence of  mitral valve stenosis. Tricuspid Valve: The tricuspid valve is normal in structure. Tricuspid valve regurgitation is mild to moderate. No evidence of tricuspid stenosis. Aortic Valve: The aortic valve is tricuspid. Aortic valve regurgitation is mild. Mild to moderate aortic valve sclerosis/calcification is present, without any evidence of aortic stenosis. Pulmonic Valve: The pulmonic valve was normal in structure. Pulmonic valve regurgitation is trivial. No evidence of pulmonic stenosis. Aorta: The aortic root is normal in size and structure. Venous: The inferior vena cava is normal in size with greater than 50% respiratory variability, suggesting right atrial pressure of 3 mmHg. IAS/Shunts: No atrial level shunt detected by color flow Doppler.  LEFT VENTRICLE PLAX 2D LVIDd:         3.60 cm LVIDs:         2.60 cm LV PW:         1.00 cm LV IVS:        1.00 cm LVOT diam:     1.80 cm LV SV:         45 LV SV Index:   24 LVOT Area:     2.54 cm  LV Volumes (MOD) LV vol d, MOD A4C: 48.4 ml LV vol s, MOD A4C: 20.7 ml LV SV MOD A4C:     48.4 ml RIGHT VENTRICLE RV Basal diam:  3.50 cm RV S prime:     10.70 cm/s TAPSE (M-mode): 1.3 cm LEFT ATRIUM             Index       RIGHT ATRIUM           Index LA diam:        3.00 cm 1.63 cm/m  RA Area:     12.90 cm LA Vol (A2C):   48.7 ml 26.52 ml/m RA Volume:   27.60 ml  15.03 ml/m LA Vol (A4C):   28.5 ml 15.52 ml/m LA Biplane Vol: 38.0 ml 20.69 ml/m  AORTIC VALVE LVOT Vmax:   111.00 cm/s LVOT Vmean:  78.000 cm/s LVOT VTI:    0.175 m  AORTA Ao Root diam: 2.90 cm Ao Asc diam:  3.00 cm TRICUSPID VALVE TR Peak grad:   47.9 mmHg TR Vmax:        346.00 cm/s  SHUNTS Systemic VTI:  0.18 m Systemic Diam: 1.80 cm Armanda Magic MD Electronically signed by Armanda Magic MD Signature Date/Time: 09/12/2020/1:39:49 PM    Final    VAS [place] LOWER EXTREMITY VENOUS (DVT)  Result Date: 09/12/2020  Lower Venous DVTStudy Indications: Covid-19, elevated D-Dimer.  Comparison Study: No prior study on  file Performing Technologist: Sherren Kerns RVS  Examination Guidelines: A complete evaluation includes B-mode imaging, spectral Doppler, color Doppler, and power Doppler as needed of all  accessible portions of each vessel. Bilateral testing is considered an integral part of a complete examination. Limited examinations for reoccurring indications may be performed as noted. The reflux portion of the exam is performed with the patient in reverse Trendelenburg.  +---------+---------------+---------+-----------+----------+--------------+  RIGHT     Compressibility Phasicity Spontaneity Properties Thrombus Aging  +---------+---------------+---------+-----------+----------+--------------+  CFV       Full            Yes       Yes                                    +---------+---------------+---------+-----------+----------+--------------+  SFJ       Full                                                             +---------+---------------+---------+-----------+----------+--------------+  FV Prox   Full                                                             +---------+---------------+---------+-----------+----------+--------------+  FV Mid    Full                                                             +---------+---------------+---------+-----------+----------+--------------+  FV Distal Full                                                             +---------+---------------+---------+-----------+----------+--------------+  PFV       Full                                                             +---------+---------------+---------+-----------+----------+--------------+  POP       Full            Yes       Yes                                    +---------+---------------+---------+-----------+----------+--------------+  PTV       None                                             Acute           +---------+---------------+---------+-----------+----------+--------------+  PERO      None  Acute           +---------+---------------+---------+-----------+----------+--------------+   +---------+---------------+---------+-----------+----------+--------------+  LEFT      Compressibility Phasicity Spontaneity Properties Thrombus Aging  +---------+---------------+---------+-----------+----------+--------------+  CFV       Full            Yes       Yes                                    +---------+---------------+---------+-----------+----------+--------------+  SFJ       Full                                                             +---------+---------------+---------+-----------+----------+--------------+  FV Prox   Full                                                             +---------+---------------+---------+-----------+----------+--------------+  FV Mid    Full                                                             +---------+---------------+---------+-----------+----------+--------------+  FV Distal Full                                                             +---------+---------------+---------+-----------+----------+--------------+  PFV       Full                                                             +---------+---------------+---------+-----------+----------+--------------+  POP       Full            Yes       Yes                                    +---------+---------------+---------+-----------+----------+--------------+  PTV       None                                             Acute           +---------+---------------+---------+-----------+----------+--------------+  PERO      None  Acute           +---------+---------------+---------+-----------+----------+--------------+     Summary: RIGHT: - Findings consistent with acute deep vein thrombosis involving the right posterior tibial veins, and right peroneal veins.  LEFT: - Findings consistent with acute deep vein thrombosis involving the left posterior tibial  veins, and left peroneal veins.  *See table(s) above for measurements and observations. Electronically signed by Coral Else MD on 09/12/2020 at 7:40:40 PM.    Final        LOS: 3 days   Osvaldo Shipper  [facility] Pager on [email or web address]  09/13/2020, 11:10 AM

## 2020-09-13 NOTE — Plan of Care (Signed)

## 2020-09-14 ENCOUNTER — Inpatient Hospital Stay (HOSPITAL_COMMUNITY): Payer: Medicare PPO

## 2020-09-14 DIAGNOSIS — U071 COVID-19: Secondary | ICD-10-CM | POA: Diagnosis not present

## 2020-09-14 DIAGNOSIS — I4891 Unspecified atrial fibrillation: Secondary | ICD-10-CM | POA: Diagnosis not present

## 2020-09-14 DIAGNOSIS — I82403 Acute embolism and thrombosis of unspecified deep veins of lower extremity, bilateral: Secondary | ICD-10-CM | POA: Diagnosis not present

## 2020-09-14 DIAGNOSIS — J9601 Acute respiratory failure with hypoxia: Secondary | ICD-10-CM | POA: Diagnosis not present

## 2020-09-14 LAB — CBC WITH DIFFERENTIAL/PLATELET
Abs Immature Granulocytes: 0.48 10*3/uL — ABNORMAL HIGH (ref 0.00–0.07)
Basophils Absolute: 0.1 10*3/uL (ref 0.0–0.1)
Basophils Relative: 0 %
Eosinophils Absolute: 0 10*3/uL (ref 0.0–0.5)
Eosinophils Relative: 0 %
HCT: 41.6 % (ref 36.0–46.0)
Hemoglobin: 14.8 g/dL (ref 12.0–15.0)
Immature Granulocytes: 3 %
Lymphocytes Relative: 6 %
Lymphs Abs: 1 10*3/uL (ref 0.7–4.0)
MCH: 28 pg (ref 26.0–34.0)
MCHC: 35.6 g/dL (ref 30.0–36.0)
MCV: 78.8 fL — ABNORMAL LOW (ref 80.0–100.0)
Monocytes Absolute: 0.9 10*3/uL (ref 0.1–1.0)
Monocytes Relative: 5 %
Neutro Abs: 14.6 10*3/uL — ABNORMAL HIGH (ref 1.7–7.7)
Neutrophils Relative %: 86 %
Platelets: 512 10*3/uL — ABNORMAL HIGH (ref 150–400)
RBC: 5.28 MIL/uL — ABNORMAL HIGH (ref 3.87–5.11)
RDW: 12.7 % (ref 11.5–15.5)
WBC: 17 10*3/uL — ABNORMAL HIGH (ref 4.0–10.5)
nRBC: 0.1 % (ref 0.0–0.2)

## 2020-09-14 LAB — D-DIMER, QUANTITATIVE: D-Dimer, Quant: >20 ug/mL-FEU — ABNORMAL HIGH (ref 0.00–0.50)

## 2020-09-14 LAB — COMPREHENSIVE METABOLIC PANEL
ALT: 36 U/L (ref 0–44)
AST: 25 U/L (ref 15–41)
Albumin: 2.5 g/dL — ABNORMAL LOW (ref 3.5–5.0)
Alkaline Phosphatase: 75 U/L (ref 38–126)
Anion gap: 14 (ref 5–15)
BUN: 31 mg/dL — ABNORMAL HIGH (ref 8–23)
CO2: 17 mmol/L — ABNORMAL LOW (ref 22–32)
Calcium: 9.2 mg/dL (ref 8.9–10.3)
Chloride: 114 mmol/L — ABNORMAL HIGH (ref 98–111)
Creatinine, Ser: 0.79 mg/dL (ref 0.44–1.00)
GFR calc Af Amer: >60 mL/min (ref >60)
GFR calc non Af Amer: >60 mL/min (ref >60)
Glucose, Bld: 193 mg/dL — ABNORMAL HIGH (ref 70–99)
Potassium: 5 mmol/L (ref 3.5–5.1)
Sodium: 145 mmol/L (ref 135–145)
Total Bilirubin: 0.8 mg/dL (ref 0.3–1.2)
Total Protein: 6.3 g/dL — ABNORMAL LOW (ref 6.5–8.1)

## 2020-09-14 LAB — GLUCOSE, CAPILLARY
Glucose-Capillary: 227 mg/dL — ABNORMAL HIGH (ref 70–99)
Glucose-Capillary: 246 mg/dL — ABNORMAL HIGH (ref 70–99)
Glucose-Capillary: 228 mg/dL — ABNORMAL HIGH (ref 70–99)
Glucose-Capillary: 208 mg/dL — ABNORMAL HIGH (ref 70–99)
Glucose-Capillary: 192 mg/dL — ABNORMAL HIGH (ref 70–99)

## 2020-09-14 LAB — MAGNESIUM: Magnesium: 2.1 mg/dL (ref 1.7–2.4)

## 2020-09-14 LAB — T4, FREE: Free T4: 1.22 ng/dL — ABNORMAL HIGH (ref 0.61–1.12)

## 2020-09-14 LAB — HEMOGLOBIN A1C
Hgb A1c MFr Bld: 6.4 % — ABNORMAL HIGH (ref 4.8–5.6)
Mean Plasma Glucose: 136.98 mg/dL

## 2020-09-14 LAB — C-REACTIVE PROTEIN: CRP: 3.1 mg/dL — ABNORMAL HIGH (ref <1.0)

## 2020-09-14 MED ORDER — FUROSEMIDE 10 MG/ML IJ SOLN
20.0000 mg | Freq: Once | INTRAMUSCULAR | Status: AC
  Administered 2020-09-14: 20 mg via INTRAVENOUS
  Filled 2020-09-14: qty 2

## 2020-09-14 MED ORDER — DILTIAZEM LOAD VIA INFUSION
10.0000 mg | Freq: Once | INTRAVENOUS | Status: AC
  Administered 2020-09-14: 10 mg via INTRAVENOUS
  Filled 2020-09-14: qty 10

## 2020-09-14 MED ORDER — DILTIAZEM HCL-DEXTROSE 125-5 MG/125ML-% IV SOLN (PREMIX)
5.0000 mg/h | INTRAVENOUS | Status: DC
  Administered 2020-09-14: 5 mg/h via INTRAVENOUS
  Administered 2020-09-15: 10 mg/h via INTRAVENOUS
  Administered 2020-09-16: 5 mg/h via INTRAVENOUS
  Administered 2020-09-17: 15 mg/h via INTRAVENOUS
  Administered 2020-09-17: 5 mg/h via INTRAVENOUS
  Administered 2020-09-18: 2.5 mg/h via INTRAVENOUS
  Filled 2020-09-14 (×6): qty 125

## 2020-09-14 MED ORDER — INSULIN ASPART 100 UNIT/ML ~~LOC~~ SOLN
0.0000 [IU] | Freq: Every day | SUBCUTANEOUS | Status: DC
  Administered 2020-09-17 – 2020-09-22 (×3): 2 [IU] via SUBCUTANEOUS

## 2020-09-14 MED ORDER — INSULIN ASPART 100 UNIT/ML ~~LOC~~ SOLN
0.0000 [IU] | Freq: Three times a day (TID) | SUBCUTANEOUS | Status: DC
  Administered 2020-09-14 (×2): 7 [IU] via SUBCUTANEOUS
  Administered 2020-09-15: 4 [IU] via SUBCUTANEOUS
  Administered 2020-09-15: 20 [IU] via SUBCUTANEOUS
  Administered 2020-09-15: 7 [IU] via SUBCUTANEOUS
  Administered 2020-09-16 (×2): 4 [IU] via SUBCUTANEOUS
  Administered 2020-09-16: 11 [IU] via SUBCUTANEOUS
  Administered 2020-09-17 (×2): 4 [IU] via SUBCUTANEOUS
  Administered 2020-09-17: 7 [IU] via SUBCUTANEOUS
  Administered 2020-09-18: 4 [IU] via SUBCUTANEOUS
  Administered 2020-09-18: 3 [IU] via SUBCUTANEOUS
  Administered 2020-09-18: 7 [IU] via SUBCUTANEOUS
  Administered 2020-09-19: 4 [IU] via SUBCUTANEOUS
  Administered 2020-09-19: 7 [IU] via SUBCUTANEOUS
  Administered 2020-09-20: 3 [IU] via SUBCUTANEOUS
  Administered 2020-09-20 – 2020-09-22 (×2): 7 [IU] via SUBCUTANEOUS
  Administered 2020-09-23: 4 [IU] via SUBCUTANEOUS
  Administered 2020-09-23: 3 [IU] via SUBCUTANEOUS
  Administered 2020-09-24: 7 [IU] via SUBCUTANEOUS
  Administered 2020-09-24 (×2): 3 [IU] via SUBCUTANEOUS
  Administered 2020-09-25 (×3): 4 [IU] via SUBCUTANEOUS
  Administered 2020-09-26: 3 [IU] via SUBCUTANEOUS
  Administered 2020-09-26: 4 [IU] via SUBCUTANEOUS
  Administered 2020-09-26: 3 [IU] via SUBCUTANEOUS
  Administered 2020-09-27 (×2): 4 [IU] via SUBCUTANEOUS
  Administered 2020-09-28: 3 [IU] via SUBCUTANEOUS
  Administered 2020-09-28: 7 [IU] via SUBCUTANEOUS
  Administered 2020-09-29 – 2020-10-01 (×4): 4 [IU] via SUBCUTANEOUS
  Administered 2020-10-01: 3 [IU] via SUBCUTANEOUS
  Administered 2020-10-02 (×2): 4 [IU] via SUBCUTANEOUS
  Administered 2020-10-03: 7 [IU] via SUBCUTANEOUS
  Administered 2020-10-03: 4 [IU] via SUBCUTANEOUS
  Administered 2020-10-04: 3 [IU] via SUBCUTANEOUS
  Administered 2020-10-05: 7 [IU] via SUBCUTANEOUS

## 2020-09-14 MED ORDER — INSULIN GLARGINE 100 UNIT/ML ~~LOC~~ SOLN
8.0000 [IU] | Freq: Every day | SUBCUTANEOUS | Status: DC
  Administered 2020-09-14 – 2020-10-03 (×20): 8 [IU] via SUBCUTANEOUS
  Filled 2020-09-14 (×23): qty 0.08

## 2020-09-14 MED ORDER — METOPROLOL TARTRATE 50 MG PO TABS
75.0000 mg | ORAL_TABLET | Freq: Two times a day (BID) | ORAL | Status: DC
  Administered 2020-09-14 – 2020-09-16 (×5): 75 mg via ORAL
  Filled 2020-09-14 (×5): qty 1

## 2020-09-14 NOTE — Progress Notes (Signed)
RN assessing patient and HR is in 160's sustaining a fib RVR and patient SPO2 in mid 80's on NRB and HFNC. Pt. Had pulled mask down and slow to recover. IV metoprolol was given earlier and PO metoprolol given also without resolving high heart rate. MD notified and cardizem ordered for high heart rate. Patient being more compliant and not taking mask off and SPO2 now 95%. HR now in low 100's. RN will continue to monitor.  MEWS documentation. Dr. Rito Ehrlich is aware of patient Heart rate and SPO2 status. New orders given to correct. Pt MEWS was red, then green and back to Yellow. VS cycling frequently. Patient is slowly improving, but will desat when NRB is off and HR will climb accordingly. Pt. Counseled.

## 2020-09-14 NOTE — Progress Notes (Addendum)
PROGRESS NOTE  Hannah Torres TFT:[id number] DOB: March 26, 1946 DOA: 2020-09-11  PCP: Pcp, No  Brief History/Interval Summary: 74 y.o. female with medical history significant of hypertension, former smoker presented to emergency department with worsening shortness of breath since 4 days.  She is not vaccinated against COVID-19.  Patient was noted to be hypoxic in the emergency department.  Chest x-ray showed bilateral opacities.  Patient was hospitalized for further management.  Reason for Visit: Pneumonia due to COVID-19.  Acute respiratory failure with hypoxia  Consultants: None  Procedures: None  Antibiotics: Anti-infectives (From admission, onward)   Start     Dose/Rate Route Frequency Ordered Stop   09/11/20 1000  remdesivir 100 mg in sodium chloride 0.9 % 100 mL IVPB       "Followed by" Linked Group Details   100 mg 200 mL/hr over 30 Minutes Intravenous Daily 09/11/20 1815 09/15/20 0959   09/11/2020 2000  remdesivir 200 mg in sodium chloride 0.9% 250 mL IVPB       "Followed by" Linked Group Details   200 mg 580 mL/hr over 30 Minutes Intravenous Once 2020/09/11 1815 2020-09-11 2228   September 11, 2020 1530  cefTRIAXone (ROCEPHIN) 2 g in sodium chloride 0.9 % 100 mL IVPB  Status:  Discontinued        2 g 200 mL/hr over 30 Minutes Intravenous Every 24 hours 2020/09/11 1517 09/11/20 0704   09/11/20 1530  azithromycin (ZITHROMAX) 500 mg in sodium chloride 0.9 % 250 mL IVPB  Status:  Discontinued        500 mg 250 mL/hr over 60 Minutes Intravenous Every 24 hours 09-11-2020 1517 09/11/20 0704      Subjective/Interval History: Patient states that she is feeling well.  Continues to have some shortness of breath dry cough.  No chest pain.  No dizziness or lightheadedness.    Telemetry reviewed.  Remains in atrial fibrillation.  Heart rate was reasonably well controlled during the course of the night but noted to be tachycardic this morning.  Assessment/Plan:  Acute Hypoxic Resp. Failure/Pneumonia  due to COVID-19   Recent Labs  Lab 11-Sep-2020 1335 2020-09-11 1420 09/11/20 0500 09/11/20 0833 09/12/20 0317 09/13/20 0411 09/14/20 0046  DDIMER 3.17*  --  5.10*  --  >20.00* >20.00* >20.00*  FERRITIN 1,946*  --   --  1,978*  --   --   --   CRP 18.8*  --   --  20.6* 13.0* 5.8* 3.1*  ALT 52*  --   --   --  46* 36 36  PROCALCITON  --  <0.10  --   --   --   --   --     Objective findings: Oxygen requirements: On nonrebreather along with high flow nasal cannula saturating in the mid 90s.    COVID 19 Therapeutics: Antibacterials: Antibacterials were discontinued. Remdesivir: Day 5 Steroids: Solu-Medrol Diuretics: Lasix x1 given on 10/4.  Will repeat. Inhaled Steroids:  Dulera Baricitinib: Initiated 10/1 after discussions of risks and benefits PUD Prophylaxis: Pepcid DVT Prophylaxis:  on therapeutic Lovenox due to A. fib and DVT  From a respiratory standpoint patient has noted to be requiring more oxygen today.  Chest x-ray done this morning shows worsening opacities.  We will give her additional dose of furosemide today.  Continue with Remdesivir steroids and baricitinib.  Her D-dimer remains greater than 20.  See discussion below.  CRP is improved to 3.1.  Patient encouraged to stay in prone position as much as possible.  Incentive spirometer.  Mobilization.  The treatment plan and use of medications and known side effects were discussed with patient/family. Some of the medications used are based on case reports/anecdotal data.  All other medications being used in the management of COVID-19 based on limited study data.  Complete risks and long-term side effects are unknown, however in the best clinical judgment they seem to be of some benefit.  Patient/family wanted to proceed with treatment options provided.  Acute DVT bilateral lower extremities/presumed PE Patient found to have a bilateral lower extremity DVT.  Likely has acute PE as well.  Continue with therapeutic Lovenox for now.   Transition to oral anticoagulation when she is more stable.  D-dimer remains greater than 20.    Atrial fibrillation with RVR No previous history of same.  Echocardiogram showed normal systolic function.  No valvular abnormalities of concern noted. Atrial fibrillation likely triggered by her respiratory illness.  Heart rate remains poorly controlled.  We will increase the dose of her metoprolol.  If these strategies do not work we will discuss with cardiology.  Would have like to use amiodarone but with her abnormal thyroid function test this could be problematic.  Have to utilize amiodarone.  Continue Lovenox.   Outpatient cardiology follow-up.  See below regarding thyroid test.  Hyperglycemia No known history of diabetes.  HbA1c 6.4.  Hyperglycemia likely due to steroids.  Change SSI to resistant.  May need to use long-acting insulin.  Abnormal thyroid function tests TSH noted to be 0.157.  Free T4 noted to be 1.22 which is only slightly above the normal range.  She does not have any other symptoms suggestive of hyperthyroidism.  Continue beta-blockers as above.  Essential hypertension Blood pressure is noted to be stable.  Monitor while we are titrating her beta-blocker and giving her diuretics.  Acute kidney injury Baseline kidney function is not known.  BUN and creatinine was noted to be elevated.  She was given IV fluids.  Renal function is stable.   Avoid nephrotoxic agents.  Monitor urine output.  Thrombocytosis/leukocytosis Likely reactive due to acute infection.  Elevated WBC likely due to steroids.  Transaminitis Secondary to COVID-19.  Now resolved.  DVT Prophylaxis: On therapeutic Lovenox Code Status: Full code Family Communication: Daughter being updated daily Disposition Plan: Hopefully return home when improved  Status is: Inpatient  Remains inpatient appropriate because:IV treatments appropriate due to intensity of illness or inability to take PO and Inpatient level  of care appropriate due to severity of illness   Dispo:  Patient From: Home  Planned Disposition: Home  Expected discharge date: 09/16/20  Medically stable for discharge: No     Medications:  Scheduled: . vitamin C  500 mg Oral Daily  . baricitinib  4 mg Oral Daily  . enoxaparin (LOVENOX) injection  1 mg/kg Subcutaneous Q12H  . famotidine  20 mg Oral Daily  . influenza vaccine adjuvanted  0.5 mL Intramuscular Tomorrow-1000  . insulin aspart  0-15 Units Subcutaneous TID WC  . methylPREDNISolone (SOLU-MEDROL) injection  80 mg Intravenous Q12H  . metoprolol tartrate  75 mg Oral BID  . mometasone-formoterol  2 puff Inhalation BID  . zinc sulfate  220 mg Oral Daily   Continuous: . remdesivir 100 mg in NS 100 mL Stopped (09/13/20 1128)   YTK:ZSWFUXNATFTDD, chlorpheniramine-HYDROcodone, guaiFENesin-dextromethorphan, loperamide, metoprolol tartrate, ondansetron **OR** ondansetron (ZOFRAN) IV   Objective:  Vital Signs  Vitals:   09/14/20 0710 09/14/20 0715 09/14/20 0720 09/14/20 0750  BP:    112/73  Pulse:  Resp: (!) 29 (!) 23 (!) 27 (!) 24  Temp:      TempSrc:      SpO2: 97% 96% 97% 92%  Weight:      Height:        Intake/Output Summary (Last 24 hours) at 09/14/2020 0946 Last data filed at 09/13/2020 1500 Gross per 24 hour  Intake 250 ml  Output --  Net 250 ml   Filed Weights   09/20/2020 2150 09/24/2020 2155 09/11/20 1649  Weight: 78.5 kg 79.4 kg 76.2 kg    General appearance: Awake alert.  In no distress Resp: Noted to be tachypneic.  No use of accessory muscles.  Coarse breath sounds with crackles bilaterally.  No wheezing or rhonchi.   Cardio: S1-S2 is irregularly irregular.  Tachycardic.  Telemetry reviewed as discussed above. GI: Abdomen is soft.  Nontender nondistended.  Bowel sounds are present normal.  No masses organomegaly Extremities: No edema.  Full range of motion of lower extremities. Neurologic:   No focal neurological deficits.      Lab  Results:  Data Reviewed: I have personally reviewed following labs and imaging studies  CBC: Recent Labs  Lab 09/26/2020 1335 10/02/2020 1335 09/25/2020 2244 09/11/20 0500 09/12/20 0317 09/13/20 0411 09/14/20 0046  WBC 10.0   < > 7.7 6.1 12.8* 18.0* 17.0*  NEUTROABS 8.1*  --   --  4.5 9.7* 15.0* 14.6*  HGB 14.2   < > 14.6 12.7 12.6 13.7 14.8  HCT 40.5   < > 40.5 36.7 34.9* 37.4 41.6  MCV 78.2*   < > 77.1* 81.2 77.7* 77.9* 78.8*  PLT 552*   < > 461* 507* 433* 483* 512*   < > = values in this interval not displayed.    Basic Metabolic Panel: Recent Labs  Lab 09/25/2020 1335 09/25/2020 2244 09/12/20 0317 09/13/20 0411 09/14/20 0046  NA 141  --  144 146* 145  K 4.2  --  4.4 4.7 5.0  CL 107  --  112* 115* 114*  CO2 17*  --  19* 18* 17*  GLUCOSE 197*  --  225* 200* 193*  BUN 64*  --  30* 28* 31*  CREATININE 1.37* 0.95 0.90 0.94 0.79  CALCIUM 9.1  --  9.6 9.5 9.2  MG  --   --  2.2 2.2 2.1    GFR: Estimated Creatinine Clearance: 64 mL/min (by C-G formula based on SCr of 0.79 mg/dL).  Liver Function Tests: Recent Labs  Lab 09/24/2020 1335 09/12/20 0317 09/13/20 0411 09/14/20 0046  AST 46* 43* 30 25  ALT 52* 46* 36 36  ALKPHOS 67 61 70 75  BILITOT 1.3* 0.8 0.6 0.8  PROT 7.7 6.8 6.4* 6.3*  ALBUMIN 3.1* 2.6* 2.2* 2.5*     Recent Results (from the past 240 hour(s))  Respiratory Panel by RT PCR (Flu A&B, Covid) - Nasopharyngeal Swab     Status: Abnormal   Collection Time: 09/12/2020  1:36 PM   Specimen: Nasopharyngeal Swab  Result Value Ref Range Status   SARS Coronavirus 2 by RT PCR POSITIVE (A) NEGATIVE Final    Comment: RESULT CALLED TO, READ BACK BY AND VERIFIED WITH: J,HAVILAND MD  09/29/2020 EB (NOTE) SARS-CoV-2 target nucleic acids are DETECTED.  SARS-CoV-2 RNA is generally detectable in upper respiratory specimens  during the acute phase of infection. Positive results are indicative of the presence of the identified virus, but do not rule out bacterial  infection or co-infection with other pathogens not detected by the  test. Clinical correlation with patient history and other diagnostic information is necessary to determine patient infection status. The expected result is Negative.  Fact Sheet for Patients:  https://www.moore.com/  Fact Sheet for Healthcare Providers: https://www.young.biz/  This test is not yet approved or cleared by the [place] FDA and  has been authorized for detection and/or diagnosis of SARS-CoV-2 by FDA under an Emergency Use Authorization (EUA).  This EUA will remain in effect (meaning this test can be used ) for the duration of  the COVID-19 declaration under Section 564(b)(1) of the Act, 21 U.S.C. section 360bbb-3(b)(1), unless the authorization is terminated or revoked sooner.      Influenza A by PCR NEGATIVE NEGATIVE Final   Influenza B by PCR NEGATIVE NEGATIVE Final    Comment: (NOTE) The Xpert Xpress SARS-CoV-2/FLU/RSV assay is intended as an aid in  the diagnosis of influenza from Nasopharyngeal swab specimens and  should not be used as a sole basis for treatment. Nasal washings and  aspirates are unacceptable for Xpert Xpress SARS-CoV-2/FLU/RSV  testing.  Fact Sheet for Patients: https://www.moore.com/  Fact Sheet for Healthcare Providers: https://www.young.biz/  This test is not yet approved or cleared by the [place] FDA and  has been authorized for detection and/or diagnosis of SARS-CoV-2 by  FDA under an Emergency Use Authorization (EUA). This EUA will remain  in effect (meaning this test can be used) for the duration of the  Covid-19 declaration under Section 564(b)(1) of the Act, 21  U.S.C. section 360bbb-3(b)(1), unless the authorization is  terminated or revoked. Performed at [facility], [address] [place], [place] [address]   Blood Culture (routine x 2)     Status: None  (Preliminary result)   Collection Time: 09/25/2020  1:57 PM   Specimen: BLOOD  Result Value Ref Range Status   Specimen Description BLOOD SITE NOT SPECIFIED  Final   Special Requests   Final    BOTTLES DRAWN AEROBIC AND ANAEROBIC Blood Culture results may not be optimal due to an inadequate volume of blood received in culture bottles   Culture   Final    NO GROWTH 4 DAYS Performed at [facility], [address] [place], [place] [address]    Report Status PENDING  Incomplete      Radiology Studies: DG Chest Port 1 View  Result Date: 09/14/2020 CLINICAL DATA:  COVID-19 pneumonia. EXAM: PORTABLE CHEST 1 VIEW COMPARISON:  10/10/2020 FINDINGS: 0621 hours. Diffuse interstitial and left greater than right basilar airspace disease is progressive in the interval. Cardiopericardial silhouette is at upper limits of normal for size. The visualized bony structures of the thorax show no acute abnormality. Degenerative changes noted right shoulder. Telemetry leads overlie the chest. IMPRESSION: Progressive diffuse interstitial and airspace disease, left greater than right. Imaging features compatible with multifocal pneumonia. Electronically Signed   By: Kennith Center M.D.   On: 09/14/2020 07:34   ECHOCARDIOGRAM COMPLETE  Result Date: 09/12/2020    ECHOCARDIOGRAM REPORT   Patient Name:   GABBRIELLE MCNICHOLAS Date of Exam: 09/12/2020 Medical Rec #:  [id number]         Height:       65.0 in Accession #:    [id number]        Weight:       167.9 lb Date of Birth:  [birth year]          BSA:          1.836 m Patient Age:  74 years          BP:           136/116 mmHg Patient Gender: F                 HR:           108 bpm. Exam Location:  Inpatient Procedure: 2D Echo, Cardiac Doppler and Color Doppler Indications:    I48.91* Unspeicified atrial fibrillation  History:        Patient has no prior history of Echocardiogram examinations.                 Covid19 positive.  Sonographer:    Roosvelt Maser RDCS Referring  Phys: [id number] [facility] IMPRESSIONS  1. Left ventricular ejection fraction, by estimation, is 60 to 65%. The left ventricle has normal function. The left ventricle has no regional wall motion abnormalities. Left ventricular diastolic function could not be evaluated.  2. Right ventricular systolic function is normal. The right ventricular size is normal.  3. The mitral valve is normal in structure. Trivial mitral valve regurgitation. No evidence of mitral stenosis.  4. Tricuspid valve regurgitation is mild to moderate.  5. The aortic valve is tricuspid. Aortic valve regurgitation is mild. Mild to moderate aortic valve sclerosis/calcification is present, without any evidence of aortic stenosis.  6. The inferior vena cava is normal in size with greater than 50% respiratory variability, suggesting right atrial pressure of 3 mmHg. FINDINGS  Left Ventricle: Left ventricular ejection fraction, by estimation, is 60 to 65%. The left ventricle has normal function. The left ventricle has no regional wall motion abnormalities. The left ventricular internal cavity size was normal in size. There is  no left ventricular hypertrophy. Left ventricular diastolic function could not be evaluated due to atrial fibrillation. Left ventricular diastolic function could not be evaluated. Right Ventricle: The right ventricular size is normal. No increase in right ventricular wall thickness. Right ventricular systolic function is normal. Left Atrium: Left atrial size was normal in size. Right Atrium: Right atrial size was normal in size. Pericardium: There is no evidence of pericardial effusion. Mitral Valve: The mitral valve is normal in structure. Trivial mitral valve regurgitation. No evidence of mitral valve stenosis. Tricuspid Valve: The tricuspid valve is normal in structure. Tricuspid valve regurgitation is mild to moderate. No evidence of tricuspid stenosis. Aortic Valve: The aortic valve is tricuspid. Aortic valve regurgitation is  mild. Mild to moderate aortic valve sclerosis/calcification is present, without any evidence of aortic stenosis. Pulmonic Valve: The pulmonic valve was normal in structure. Pulmonic valve regurgitation is trivial. No evidence of pulmonic stenosis. Aorta: The aortic root is normal in size and structure. Venous: The inferior vena cava is normal in size with greater than 50% respiratory variability, suggesting right atrial pressure of 3 mmHg. IAS/Shunts: No atrial level shunt detected by color flow Doppler.  LEFT VENTRICLE PLAX 2D LVIDd:         3.60 cm LVIDs:         2.60 cm LV PW:         1.00 cm LV IVS:        1.00 cm LVOT diam:     1.80 cm LV SV:         45 LV SV Index:   24 LVOT Area:     2.54 cm  LV Volumes (MOD) LV vol d, MOD A4C: 48.4 ml LV vol s, MOD A4C: 20.7 ml LV SV MOD A4C:  48.4 ml RIGHT VENTRICLE RV Basal diam:  3.50 cm RV S prime:     10.70 cm/s TAPSE (M-mode): 1.3 cm LEFT ATRIUM             Index       RIGHT ATRIUM           Index LA diam:        3.00 cm 1.63 cm/m  RA Area:     12.90 cm LA Vol (A2C):   48.7 ml 26.52 ml/m RA Volume:   27.60 ml  15.03 ml/m LA Vol (A4C):   28.5 ml 15.52 ml/m LA Biplane Vol: 38.0 ml 20.69 ml/m  AORTIC VALVE LVOT Vmax:   111.00 cm/s LVOT Vmean:  78.000 cm/s LVOT VTI:    0.175 m  AORTA Ao Root diam: 2.90 cm Ao Asc diam:  3.00 cm TRICUSPID VALVE TR Peak grad:   47.9 mmHg TR Vmax:        346.00 cm/s  SHUNTS Systemic VTI:  0.18 m Systemic Diam: 1.80 cm Armanda Magic MD Electronically signed by Armanda Magic MD Signature Date/Time: 09/12/2020/1:39:49 PM    Final    VAS [place] LOWER EXTREMITY VENOUS (DVT)  Result Date: 09/12/2020  Lower Venous DVTStudy Indications: Covid-19, elevated D-Dimer.  Comparison Study: No prior study on file Performing Technologist: Sherren Kerns RVS  Examination Guidelines: A complete evaluation includes B-mode imaging, spectral Doppler, color Doppler, and power Doppler as needed of all accessible portions of each vessel. Bilateral testing is  considered an integral part of a complete examination. Limited examinations for reoccurring indications may be performed as noted. The reflux portion of the exam is performed with the patient in reverse Trendelenburg.  +---------+---------------+---------+-----------+----------+--------------+ RIGHT    CompressibilityPhasicitySpontaneityPropertiesThrombus Aging +---------+---------------+---------+-----------+----------+--------------+ CFV      Full           Yes      Yes                                 +---------+---------------+---------+-----------+----------+--------------+ SFJ      Full                                                        +---------+---------------+---------+-----------+----------+--------------+ FV Prox  Full                                                        +---------+---------------+---------+-----------+----------+--------------+ FV Mid   Full                                                        +---------+---------------+---------+-----------+----------+--------------+ FV DistalFull                                                        +---------+---------------+---------+-----------+----------+--------------+ PFV  Full                                                        +---------+---------------+---------+-----------+----------+--------------+ POP      Full           Yes      Yes                                 +---------+---------------+---------+-----------+----------+--------------+ PTV      None                                         Acute          +---------+---------------+---------+-----------+----------+--------------+ PERO     None                                         Acute          +---------+---------------+---------+-----------+----------+--------------+   +---------+---------------+---------+-----------+----------+--------------+ LEFT      CompressibilityPhasicitySpontaneityPropertiesThrombus Aging +---------+---------------+---------+-----------+----------+--------------+ CFV      Full           Yes      Yes                                 +---------+---------------+---------+-----------+----------+--------------+ SFJ      Full                                                        +---------+---------------+---------+-----------+----------+--------------+ FV Prox  Full                                                        +---------+---------------+---------+-----------+----------+--------------+ FV Mid   Full                                                        +---------+---------------+---------+-----------+----------+--------------+ FV DistalFull                                                        +---------+---------------+---------+-----------+----------+--------------+ PFV      Full                                                        +---------+---------------+---------+-----------+----------+--------------+  POP      Full           Yes      Yes                                 +---------+---------------+---------+-----------+----------+--------------+ PTV      None                                         Acute          +---------+---------------+---------+-----------+----------+--------------+ PERO     None                                         Acute          +---------+---------------+---------+-----------+----------+--------------+     Summary: RIGHT: - Findings consistent with acute deep vein thrombosis involving the right posterior tibial veins, and right peroneal veins.  LEFT: - Findings consistent with acute deep vein thrombosis involving the left posterior tibial veins, and left peroneal veins.  *See table(s) above for measurements and observations. Electronically signed by Coral Else MD on 09/12/2020 at 7:40:40 PM.    Final        LOS: 4 days    Osvaldo Shipper  [facility] Pager on [email or web address]  09/14/2020, 9:46 AM

## 2020-09-15 DIAGNOSIS — J9601 Acute respiratory failure with hypoxia: Secondary | ICD-10-CM | POA: Diagnosis not present

## 2020-09-15 DIAGNOSIS — U071 COVID-19: Secondary | ICD-10-CM | POA: Diagnosis not present

## 2020-09-15 DIAGNOSIS — I4891 Unspecified atrial fibrillation: Secondary | ICD-10-CM | POA: Diagnosis not present

## 2020-09-15 DIAGNOSIS — I82403 Acute embolism and thrombosis of unspecified deep veins of lower extremity, bilateral: Secondary | ICD-10-CM | POA: Diagnosis not present

## 2020-09-15 LAB — CBC WITH DIFFERENTIAL/PLATELET
Abs Immature Granulocytes: 0.61 10*3/uL — ABNORMAL HIGH (ref 0.00–0.07)
Basophils Absolute: 0.1 10*3/uL (ref 0.0–0.1)
Basophils Relative: 0 %
Eosinophils Absolute: 0 10*3/uL (ref 0.0–0.5)
Eosinophils Relative: 0 %
HCT: 41.3 % (ref 36.0–46.0)
Hemoglobin: 15.1 g/dL — ABNORMAL HIGH (ref 12.0–15.0)
Immature Granulocytes: 3 %
Lymphocytes Relative: 6 %
Lymphs Abs: 1.2 10*3/uL (ref 0.7–4.0)
MCH: 28.5 pg (ref 26.0–34.0)
MCHC: 36.6 g/dL — ABNORMAL HIGH (ref 30.0–36.0)
MCV: 77.9 fL — ABNORMAL LOW (ref 80.0–100.0)
Monocytes Absolute: 0.6 10*3/uL (ref 0.1–1.0)
Monocytes Relative: 3 %
Neutro Abs: 18.4 10*3/uL — ABNORMAL HIGH (ref 1.7–7.7)
Neutrophils Relative %: 88 %
Platelets: 512 10*3/uL — ABNORMAL HIGH (ref 150–400)
RBC: 5.3 MIL/uL — ABNORMAL HIGH (ref 3.87–5.11)
RDW: 12.7 % (ref 11.5–15.5)
WBC: 20.9 10*3/uL — ABNORMAL HIGH (ref 4.0–10.5)
nRBC: 0.1 % (ref 0.0–0.2)

## 2020-09-15 LAB — COMPREHENSIVE METABOLIC PANEL
ALT: 33 U/L (ref 0–44)
AST: 26 U/L (ref 15–41)
Albumin: 2.6 g/dL — ABNORMAL LOW (ref 3.5–5.0)
Alkaline Phosphatase: 74 U/L (ref 38–126)
Anion gap: 13 (ref 5–15)
BUN: 35 mg/dL — ABNORMAL HIGH (ref 8–23)
CO2: 19 mmol/L — ABNORMAL LOW (ref 22–32)
Calcium: 9.4 mg/dL (ref 8.9–10.3)
Chloride: 109 mmol/L (ref 98–111)
Creatinine, Ser: 0.81 mg/dL (ref 0.44–1.00)
GFR calc non Af Amer: >60 mL/min (ref >60)
Glucose, Bld: 210 mg/dL — ABNORMAL HIGH (ref 70–99)
Potassium: 4.1 mmol/L (ref 3.5–5.1)
Sodium: 141 mmol/L (ref 135–145)
Total Bilirubin: 0.5 mg/dL (ref 0.3–1.2)
Total Protein: 7.5 g/dL (ref 6.5–8.1)

## 2020-09-15 LAB — GLUCOSE, CAPILLARY
Glucose-Capillary: 182 mg/dL — ABNORMAL HIGH (ref 70–99)
Glucose-Capillary: 392 mg/dL — ABNORMAL HIGH (ref 70–99)
Glucose-Capillary: 214 mg/dL — ABNORMAL HIGH (ref 70–99)
Glucose-Capillary: 145 mg/dL — ABNORMAL HIGH (ref 70–99)

## 2020-09-15 LAB — MAGNESIUM: Magnesium: 2 mg/dL (ref 1.7–2.4)

## 2020-09-15 LAB — C-REACTIVE PROTEIN: CRP: 1.4 mg/dL — ABNORMAL HIGH (ref <1.0)

## 2020-09-15 LAB — CULTURE, BLOOD (ROUTINE X 2): Culture: NO GROWTH

## 2020-09-15 LAB — D-DIMER, QUANTITATIVE: D-Dimer, Quant: >20 ug/mL-FEU — ABNORMAL HIGH (ref 0.00–0.50)

## 2020-09-15 NOTE — Progress Notes (Signed)
Inpatient Diabetes Program Recommendations  AACE/ADA: New Consensus Statement on Inpatient Glycemic Control (2015)  Target Ranges:  Prepandial:   less than 140 mg/dL      Peak postprandial:   less than 180 mg/dL (1-2 hours)      Critically ill patients:  140 - 180 mg/dL   Lab Results  Component Value Date   GLUCAP 392 (H) 09/15/2020   HGBA1C 6.4 (H) 09/14/2020    Review of Glycemic Control Results for Hannah Torres, Hannah Torres (MRN [id number]) as of 09/15/2020 14:27  Ref. Range 09/14/2020 20:18 09/14/2020 21:26 09/15/2020 07:32 09/15/2020 12:11  Glucose-Capillary Latest Ref Range: 70 - 99 mg/dL 758 (H) 832 (H) 549 (H) 392 (H)    Current orders for Inpatient glycemic control: Lantus 8 units QHS, Novolog 0-20 units TID, Novolog 0-5 units QHS  Inpatient Diabetes Program Recommendations:    Consider adding Novolog 3 units TID (assuming patient is consuming >50% of meals) and increasing Lantus to 10 units QHS.   Thanks, Lujean Rave, MSN, RNC-OB Diabetes Coordinator [phone] (8a-5p)

## 2020-09-15 NOTE — Progress Notes (Signed)
PROGRESS NOTE  Hannah BENETT JSH:[id number] DOB: [birth year] DOA: 10/09/2020  PCP: Pcp, No  Brief History/Interval Summary: 74 y.o. female with medical history significant of hypertension, former smoker presented to emergency department with worsening shortness of breath since 4 days.  She is not vaccinated against COVID-19.  Patient was noted to be hypoxic in the emergency department.  Chest x-ray showed bilateral opacities.  Patient was hospitalized for further management.  Reason for Visit: Pneumonia due to COVID-19.  Acute respiratory failure with hypoxia  Consultants: None  Procedures: None  Antibiotics: Anti-infectives (From admission, onward)   Start     Dose/Rate Route Frequency Ordered Stop   09/11/20 1000  remdesivir 100 mg in sodium chloride 0.9 % 100 mL IVPB       "Followed by" Linked Group Details   100 mg 200 mL/hr over 30 Minutes Intravenous Daily 09/20/2020 1815 09/14/20 1134   10/03/2020 2000  remdesivir 200 mg in sodium chloride 0.9% 250 mL IVPB       "Followed by" Linked Group Details   200 mg 580 mL/hr over 30 Minutes Intravenous Once 10/04/2020 1815 09/20/2020 2228   09/16/2020 1530  cefTRIAXone (ROCEPHIN) 2 g in sodium chloride 0.9 % 100 mL IVPB  Status:  Discontinued        2 g 200 mL/hr over 30 Minutes Intravenous Every 24 hours 10/08/2020 1517 09/11/20 0704   09/11/2020 1530  azithromycin (ZITHROMAX) 500 mg in sodium chloride 0.9 % 250 mL IVPB  Status:  Discontinued        500 mg 250 mL/hr over 60 Minutes Intravenous Every 24 hours 09/15/2020 1517 09/11/20 0704      Subjective/Interval History: Patient noted to be somewhat distracted.  Slow to respond.  Continues to have difficulty breathing.  Denies any chest pain.  No dizziness or lightheadedness.  Telemetry reviewed.  Continues to be in atrial fibrillation.    Assessment/Plan:  Acute Hypoxic Resp. Failure/Pneumonia due to COVID-19   Recent Labs  Lab 09/29/2020 1335 09/27/2020 1335 09/23/2020 1420 09/11/20 0500  09/11/20 0833 09/12/20 0317 09/13/20 0411 09/14/20 0046 09/15/20 0351  DDIMER 3.17*   < >  --  5.10*  --  >20.00* >20.00* >20.00* >20.00*  FERRITIN 1,946*  --   --   --  1,978*  --   --   --   --   CRP 18.8*   < >  --   --  20.6* 13.0* 5.8* 3.1* 1.4*  ALT 52*  --   --   --   --  46* 36 36 33  PROCALCITON  --   --  <0.10  --   --   --   --   --   --    < > = values in this interval not displayed.    Objective findings: Oxygen requirements: Remains on nonrebreather plus HFNC at 10 L.  Saturating in the mid 90s.    COVID 19 Therapeutics: Antibacterials: Antibacterials were discontinued. Remdesivir: Completed 5-day course on 10/5. Steroids: Solu-Medrol Diuretics: Lasix given on the last 2 days.  Hold today. Inhaled Steroids:  Dulera Baricitinib: Initiated 10/1 after discussions of risks and benefits PUD Prophylaxis: Pepcid DVT Prophylaxis:  on therapeutic Lovenox due to A. fib and DVT  From a respiratory standpoint patient remains tenuous.  Remains on high amounts of oxygen.  She has completed course of Remdesivir.  Continue steroids and baricitinib.  D-dimer remains greater than 20.  CRP has improved to 1.4.  Chest x-ray done  yesterday showed worsening opacities.  She was given Lasix.  Due to borderline low blood pressure we will hold off on Lasix today.  Repeat chest x-ray tomorrow.  Patient encouraged to stay in prone position as much as possible.  Incentive spirometer.  Mobilization.  The treatment plan and use of medications and known side effects were discussed with patient/family. Some of the medications used are based on case reports/anecdotal data.  All other medications being used in the management of COVID-19 based on limited study data.  Complete risks and long-term side effects are unknown, however in the best clinical judgment they seem to be of some benefit.  Patient/family wanted to proceed with treatment options provided.  Acute DVT bilateral lower extremities/presumed  PE Patient found to have a bilateral lower extremity DVT.  Likely has acute PE as well though CT scan was not done due to her tenuous respiratory status.  Continue with therapeutic Lovenox for now.  Transition to oral anticoagulation when she is more stable.  D-dimer remains greater than 20.    Atrial fibrillation with RVR No previous history of same.   Echocardiogram showed normal systolic function.  No valvular abnormalities of concern noted. Atrial fibrillation likely triggered by her respiratory illness.   Due to poorly controlled heart rate patient was started on a Cardizem infusion yesterday.  Seems to be better.  Continue for now.  Continue beta-blocker as well.  See below regarding thyroid function test.    Hyperglycemia No known history of diabetes.  HbA1c 6.4.  Hyperglycemia likely due to steroids.  Continue low-dose Lantus and SSI.  Abnormal thyroid function tests TSH noted to be 0.157.  Free T4 noted to be 1.22 which is only slightly above the normal range.  She does not have any other symptoms suggestive of hyperthyroidism.  Continue beta-blockers as above.  Essential hypertension Blood pressure is noted to be stable.  Monitor while she is on Cardizem infusion.  Acute kidney injury Baseline kidney function is not known.  BUN and creatinine was noted to be elevated.  She was given IV fluids.  Renal function has improved.  Continue to monitor.  Avoid nephrotoxic agents.    Thrombocytosis/leukocytosis Likely reactive due to acute infection.  Elevated WBC likely due to steroids.  Transaminitis Secondary to COVID-19.  Now resolved.  DVT Prophylaxis: On therapeutic Lovenox Code Status: Full code Family Communication: Daughter being updated daily Disposition Plan: Hopefully return home when improved  Status is: Inpatient  Remains inpatient appropriate because:IV treatments appropriate due to intensity of illness or inability to take PO and Inpatient level of care appropriate  due to severity of illness   Dispo:  Patient From: Home  Planned Disposition: To be determined  Expected discharge date: 09/17/20  Medically stable for discharge: No     Medications:  Scheduled: . vitamin C  500 mg Oral Daily  . baricitinib  4 mg Oral Daily  . enoxaparin (LOVENOX) injection  1 mg/kg Subcutaneous Q12H  . famotidine  20 mg Oral Daily  . influenza vaccine adjuvanted  0.5 mL Intramuscular Tomorrow-1000  . insulin aspart  0-20 Units Subcutaneous TID WC  . insulin aspart  0-5 Units Subcutaneous QHS  . insulin glargine  8 Units Subcutaneous QHS  . methylPREDNISolone (SOLU-MEDROL) injection  80 mg Intravenous Q12H  . metoprolol tartrate  75 mg Oral BID  . mometasone-formoterol  2 puff Inhalation BID  . zinc sulfate  220 mg Oral Daily   Continuous: . diltiazem (CARDIZEM) infusion 5 mg/hr (09/15/20  1016)   IRC:VELFYBOFBPZWC, chlorpheniramine-HYDROcodone, guaiFENesin-dextromethorphan, loperamide, metoprolol tartrate, ondansetron **OR** ondansetron (ZOFRAN) IV   Objective:  Vital Signs  Vitals:   09/14/20 2127 09/15/20 0100 09/15/20 0400 09/15/20 0735  BP: (!) 127/56 109/62  117/71  Pulse: (!) 105     Resp:   (!) 27 19  Temp:    98.7 F (37.1 C)  TempSrc:    Oral  SpO2:  100% 93% 97%  Weight:      Height:       No intake or output data in the 24 hours ending 09/15/20 1115 Filed Weights   09/14/20 2150 2020/09/14 2155 09/11/20 1649  Weight: 78.5 kg 79.4 kg 76.2 kg    General appearance: Awake alert.  In no distress.  Distracted Resp: Tachypneic at rest.  Coarse breath sounds laterally with crackles at the bases.  No wheezing or rhonchi. Cardio: S1-S2 is irregularly irregular.  Tachycardic at times.  No S3-S4.   GI: Abdomen is soft.  Nontender nondistended.  Bowel sounds are present normal.  No masses organomegaly Extremities: No edema.  Moving all her extremities Neurologic: Slow to respond.  No focal neurological deficits.      Lab Results:  Data  Reviewed: I have personally reviewed following labs and imaging studies  CBC: Recent Labs  Lab 09/11/20 0500 09/12/20 0317 09/13/20 0411 09/14/20 0046 09/15/20 0351  WBC 6.1 12.8* 18.0* 17.0* 20.9*  NEUTROABS 4.5 9.7* 15.0* 14.6* 18.4*  HGB 12.7 12.6 13.7 14.8 15.1*  HCT 36.7 34.9* 37.4 41.6 41.3  MCV 81.2 77.7* 77.9* 78.8* 77.9*  PLT 507* 433* 483* 512* 512*    Basic Metabolic Panel: Recent Labs  Lab 09-14-20 1335 2020-09-14 1335 2020-09-14 2244 09/12/20 0317 09/13/20 0411 09/14/20 0046 09/15/20 0351  NA 141  --   --  144 146* 145 141  K 4.2  --   --  4.4 4.7 5.0 4.1  CL 107  --   --  112* 115* 114* 109  CO2 17*  --   --  19* 18* 17* 19*  GLUCOSE 197*  --   --  225* 200* 193* 210*  BUN 64*  --   --  30* 28* 31* 35*  CREATININE 1.37*   < > 0.95 0.90 0.94 0.79 0.81  CALCIUM 9.1  --   --  9.6 9.5 9.2 9.4  MG  --   --   --  2.2 2.2 2.1 2.0   < > = values in this interval not displayed.    GFR: Estimated Creatinine Clearance: 63.2 mL/min (by C-G formula based on SCr of 0.81 mg/dL).  Liver Function Tests: Recent Labs  Lab 2020-09-14 1335 09/12/20 0317 09/13/20 0411 09/14/20 0046 09/15/20 0351  AST 46* 43* 30 25 26   ALT 52* 46* 36 36 33  ALKPHOS 67 61 70 75 74  BILITOT 1.3* 0.8 0.6 0.8 0.5  PROT 7.7 6.8 6.4* 6.3* 7.5  ALBUMIN 3.1* 2.6* 2.2* 2.5* 2.6*     Recent Results (from the past 240 hour(s))  Respiratory Panel by RT PCR (Flu A&B, Covid) - Nasopharyngeal Swab     Status: Abnormal   Collection Time: 2020-09-14  1:36 PM   Specimen: Nasopharyngeal Swab  Result Value Ref Range Status   SARS Coronavirus 2 by RT PCR POSITIVE (A) NEGATIVE Final    Comment: RESULT CALLED TO, READ BACK BY AND VERIFIED WITH: J,HAVILAND MD @1625  14-Sep-2020 EB (NOTE) SARS-CoV-2 target nucleic acids are DETECTED.  SARS-CoV-2 RNA is generally detectable in upper  respiratory specimens  during the acute phase of infection. Positive results are indicative of the presence of the identified  virus, but do not rule out bacterial infection or co-infection with other pathogens not detected by the test. Clinical correlation with patient history and other diagnostic information is necessary to determine patient infection status. The expected result is Negative.  Fact Sheet for Patients:  https://www.moore.com/https://www.fda.gov/media/142436/download  Fact Sheet for Healthcare Providers: https://www.young.biz/https://www.fda.gov/media/142435/download  This test is not yet approved or cleared by the [place] FDA and  has been authorized for detection and/or diagnosis of SARS-CoV-2 by FDA under an Emergency Use Authorization (EUA).  This EUA will remain in effect (meaning this test can be used ) for the duration of  the COVID-19 declaration under Section 564(b)(1) of the Act, 21 U.S.C. section 360bbb-3(b)(1), unless the authorization is terminated or revoked sooner.      Influenza A by PCR NEGATIVE NEGATIVE Final   Influenza B by PCR NEGATIVE NEGATIVE Final    Comment: (NOTE) The Xpert Xpress SARS-CoV-2/FLU/RSV assay is intended as an aid in  the diagnosis of influenza from Nasopharyngeal swab specimens and  should not be used as a sole basis for treatment. Nasal washings and  aspirates are unacceptable for Xpert Xpress SARS-CoV-2/FLU/RSV  testing.  Fact Sheet for Patients: https://www.moore.com/https://www.fda.gov/media/142436/download  Fact Sheet for Healthcare Providers: https://www.young.biz/https://www.fda.gov/media/142435/download  This test is not yet approved or cleared by the [place] FDA and  has been authorized for detection and/or diagnosis of SARS-CoV-2 by  FDA under an Emergency Use Authorization (EUA). This EUA will remain  in effect (meaning this test can be used) for the duration of the  Covid-19 declaration under Section 564(b)(1) of the Act, 21  U.S.C. section 360bbb-3(b)(1), unless the authorization is  terminated or revoked. Performed at [facility], [address] [place], [place] [address]   Blood Culture  (routine x 2)     Status: None   Collection Time: 09/16/2020  1:57 PM   Specimen: BLOOD  Result Value Ref Range Status   Specimen Description BLOOD SITE NOT SPECIFIED  Final   Special Requests   Final    BOTTLES DRAWN AEROBIC AND ANAEROBIC Blood Culture results may not be optimal due to an inadequate volume of blood received in culture bottles   Culture   Final    NO GROWTH 5 DAYS Performed at [facility], [address] [place], [place] [address]    Report Status 09/15/2020 FINAL  Final      Radiology Studies: DG Chest Port 1 View  Result Date: 09/14/2020 CLINICAL DATA:  COVID-19 pneumonia. EXAM: PORTABLE CHEST 1 VIEW COMPARISON:  09/13/2020 FINDINGS: 0621 hours. Diffuse interstitial and left greater than right basilar airspace disease is progressive in the interval. Cardiopericardial silhouette is at upper limits of normal for size. The visualized bony structures of the thorax show no acute abnormality. Degenerative changes noted right shoulder. Telemetry leads overlie the chest. IMPRESSION: Progressive diffuse interstitial and airspace disease, left greater than right. Imaging features compatible with multifocal pneumonia. Electronically Signed   By: Kennith CenterEric  Mansell M.D.   On: 09/14/2020 07:34       LOS: 5 days   Shreyas Piatkowski Rito EhrlichKrishnan  [facility] Pager on [email or web address]  09/15/2020, 11:15 AM

## 2020-09-16 ENCOUNTER — Inpatient Hospital Stay (HOSPITAL_COMMUNITY): Payer: Medicare PPO

## 2020-09-16 DIAGNOSIS — I82403 Acute embolism and thrombosis of unspecified deep veins of lower extremity, bilateral: Secondary | ICD-10-CM | POA: Diagnosis not present

## 2020-09-16 DIAGNOSIS — I4891 Unspecified atrial fibrillation: Secondary | ICD-10-CM | POA: Diagnosis not present

## 2020-09-16 DIAGNOSIS — U071 COVID-19: Secondary | ICD-10-CM | POA: Diagnosis not present

## 2020-09-16 DIAGNOSIS — J9601 Acute respiratory failure with hypoxia: Secondary | ICD-10-CM | POA: Diagnosis not present

## 2020-09-16 LAB — GLUCOSE, CAPILLARY
Glucose-Capillary: 159 mg/dL — ABNORMAL HIGH (ref 70–99)
Glucose-Capillary: 199 mg/dL — ABNORMAL HIGH (ref 70–99)
Glucose-Capillary: 258 mg/dL — ABNORMAL HIGH (ref 70–99)
Glucose-Capillary: 177 mg/dL — ABNORMAL HIGH (ref 70–99)

## 2020-09-16 LAB — CBC
HCT: 40.1 % (ref 36.0–46.0)
Hemoglobin: 14.1 g/dL (ref 12.0–15.0)
MCH: 27.2 pg (ref 26.0–34.0)
MCHC: 35.2 g/dL (ref 30.0–36.0)
MCV: 77.4 fL — ABNORMAL LOW (ref 80.0–100.0)
Platelets: 453 10*3/uL — ABNORMAL HIGH (ref 150–400)
RBC: 5.18 MIL/uL — ABNORMAL HIGH (ref 3.87–5.11)
RDW: 12.5 % (ref 11.5–15.5)
WBC: 24.1 10*3/uL — ABNORMAL HIGH (ref 4.0–10.5)
nRBC: 0.1 % (ref 0.0–0.2)

## 2020-09-16 LAB — BASIC METABOLIC PANEL
Anion gap: 11 (ref 5–15)
BUN: 37 mg/dL — ABNORMAL HIGH (ref 8–23)
CO2: 19 mmol/L — ABNORMAL LOW (ref 22–32)
Calcium: 9.3 mg/dL (ref 8.9–10.3)
Chloride: 110 mmol/L (ref 98–111)
Creatinine, Ser: 0.81 mg/dL (ref 0.44–1.00)
GFR calc non Af Amer: >60 mL/min (ref >60)
Glucose, Bld: 162 mg/dL — ABNORMAL HIGH (ref 70–99)
Potassium: 4.5 mmol/L (ref 3.5–5.1)
Sodium: 140 mmol/L (ref 135–145)

## 2020-09-16 LAB — BRAIN NATRIURETIC PEPTIDE: B Natriuretic Peptide: 89.9 pg/mL (ref 0.0–100.0)

## 2020-09-16 MED ORDER — FUROSEMIDE 10 MG/ML IJ SOLN
20.0000 mg | Freq: Once | INTRAMUSCULAR | Status: AC
  Administered 2020-09-16: 20 mg via INTRAVENOUS
  Filled 2020-09-16: qty 2

## 2020-09-16 MED ORDER — METOPROLOL TARTRATE 50 MG PO TABS
50.0000 mg | ORAL_TABLET | Freq: Two times a day (BID) | ORAL | Status: DC
  Administered 2020-09-16: 50 mg via ORAL
  Filled 2020-09-16 (×2): qty 1

## 2020-09-16 NOTE — Progress Notes (Signed)
PROGRESS NOTE  Hannah Torres WGN:[id number] DOB: 11/27/1946 DOA: 10/10/2020  PCP: Pcp, No  Brief History/Interval Summary: 74 y.o. female with medical history significant of hypertension, former smoker presented to emergency department with worsening shortness of breath since 4 days.  She is not vaccinated against COVID-19.  Patient was noted to be hypoxic in the emergency department.  Chest x-ray showed bilateral opacities.  Patient was hospitalized for further management.  Reason for Visit: Pneumonia due to COVID-19.  Acute respiratory failure with hypoxia  Consultants: None  Procedures: None  Antibiotics: Anti-infectives (From admission, onward)   Start     Dose/Rate Route Frequency Ordered Stop   09/11/20 1000  remdesivir 100 mg in sodium chloride 0.9 % 100 mL IVPB       "Followed by" Linked Group Details   100 mg 200 mL/hr over 30 Minutes Intravenous Daily 2019-12-22 1815 09/14/20 1134   2019-12-22 2000  remdesivir 200 mg in sodium chloride 0.9% 250 mL IVPB       "Followed by" Linked Group Details   200 mg 580 mL/hr over 30 Minutes Intravenous Once 2019-12-22 1815 2019-12-22 2228   2019-12-22 1530  cefTRIAXone (ROCEPHIN) 2 g in sodium chloride 0.9 % 100 mL IVPB  Status:  Discontinued        2 g 200 mL/hr over 30 Minutes Intravenous Every 24 hours 2019-12-22 1517 09/11/20 0704   2019-12-22 1530  azithromycin (ZITHROMAX) 500 mg in sodium chloride 0.9 % 250 mL IVPB  Status:  Discontinued        500 mg 250 mL/hr over 60 Minutes Intravenous Every 24 hours 2019-12-22 1517 09/11/20 0704      Subjective/Interval History: No overnight issues reported by nursing staff.  Patient continues to have difficulty breathing though she states that she is slightly better.  Denies any chest pain.  No lightheadedness or dizziness.  Telemetry reviewed.    Assessment/Plan:  Acute Hypoxic Resp. Failure/Pneumonia due to COVID-19   Recent Labs  Lab 2019-12-22 1335 2019-12-22 1335 2019-12-22 1420 09/11/20 0500  09/11/20 0833 09/12/20 0317 09/13/20 0411 09/14/20 0046 09/15/20 0351  DDIMER 3.17*   < >  --  5.10*  --  >20.00* >20.00* >20.00* >20.00*  FERRITIN 1,946*  --   --   --  1,978*  --   --   --   --   CRP 18.8*   < >  --   --  20.6* 13.0* 5.8* 3.1* 1.4*  ALT 52*  --   --   --   --  46* 36 36 33  PROCALCITON  --   --  <0.10  --   --   --   --   --   --    < > = values in this interval not displayed.    Objective findings: Oxygen requirements: Mains on high flow nasal cannula at 9 L/min along with a nonrebreather.  Saturating in the mid to late 90s.    COVID 19 Therapeutics: Antibacterials: Antibacterials were discontinued. Remdesivir: Completed 5-day course on 10/5. Steroids: Solu-Medrol Diuretics: Lasix given on 10/4 and 10/5.  Held on 10/6.  Will give additional dose today.   Inhaled Steroids:  Dulera Baricitinib: Initiated 10/1 after discussions of risks and benefits PUD Prophylaxis: Pepcid DVT Prophylaxis:  on therapeutic Lovenox due to A. fib and DVT  From a respiratory standpoint patient remains tenuous though she seems to be showing signs of improvement.  She has completed course of Remdesivir.  Remains on steroids.  Remains on baricitinib.  D-dimer remains greater than 20.  CRP has improved.  Will give additional dose of furosemide today.  Chest x-ray showed stable findings.  Patient encouraged to stay in prone position as much as possible.  Incentive spirometer.  Mobilization.  The treatment plan and use of medications and known side effects were discussed with patient/family. Some of the medications used are based on case reports/anecdotal data.  All other medications being used in the management of COVID-19 based on limited study data.  Complete risks and long-term side effects are unknown, however in the best clinical judgment they seem to be of some benefit.  Patient/family wanted to proceed with treatment options provided.  Acute DVT bilateral lower extremities/presumed  PE Patient found to have a bilateral lower extremity DVT.  Likely has acute PE as well though CT scan was not done due to her tenuous respiratory status.   Continue with therapeutic Lovenox for now.  Transition to oral agents when she is more stable.  D-dimer remains greater than 20.     Atrial fibrillation with RVR No previous history of same.   Echocardiogram showed normal systolic function.  No valvular abnormalities of concern noted. Atrial fibrillation likely triggered by her respiratory illness.   Due to poorly controlled heart rate patient was started on a Cardizem infusion.   Patient seems to be stable on 5 mg/h of diltiazem.  Continue beta-blocker as well.  We will lower the dose of metoprolol due to borderline low blood pressures.  See below regarding thyroid function test.    Hyperglycemia No known history of diabetes.  HbA1c 6.4.  Hyperglycemia likely due to steroids.  Continue low-dose Lantus and SSI.  Abnormal thyroid function tests TSH noted to be 0.157.  Free T4 noted to be 1.22 which is only slightly above the normal range.  She does not have any other symptoms suggestive of hyperthyroidism.  Continue beta-blockers as above.  Recommend rechecking her thyroid function tests in 2 to 3 weeks.  Essential hypertension Blood pressure is noted to be stable.  Monitor while she is on Cardizem infusion and metoprolol.  Acute kidney injury Baseline kidney function is not known.  BUN and creatinine was noted to be elevated.  She was given IV fluids.  Renal function has resolved.  Continue to monitor.  Avoid nephrotoxic agents.    Thrombocytosis/leukocytosis Likely reactive due to acute infection.  Elevated WBC likely due to steroids.  Platelet counts are improving.  Transaminitis Secondary to COVID-19.  Now resolved.  DVT Prophylaxis: On therapeutic Lovenox Code Status: Full code Family Communication: Daughter being updated daily Disposition Plan: Hopefully return home when  improved  Status is: Inpatient  Remains inpatient appropriate because:IV treatments appropriate due to intensity of illness or inability to take PO and Inpatient level of care appropriate due to severity of illness   Dispo:  Patient From: Home  Planned Disposition: To be determined  Expected discharge date: 09/17/20  Medically stable for discharge: No     Medications:  Scheduled: . vitamin C  500 mg Oral Daily  . baricitinib  4 mg Oral Daily  . enoxaparin (LOVENOX) injection  1 mg/kg Subcutaneous Q12H  . famotidine  20 mg Oral Daily  . influenza vaccine adjuvanted  0.5 mL Intramuscular Tomorrow-1000  . insulin aspart  0-20 Units Subcutaneous TID WC  . insulin aspart  0-5 Units Subcutaneous QHS  . insulin glargine  8 Units Subcutaneous QHS  . methylPREDNISolone (SOLU-MEDROL) injection  80 mg Intravenous Q12H  .  metoprolol tartrate  75 mg Oral BID  . mometasone-formoterol  2 puff Inhalation BID  . zinc sulfate  220 mg Oral Daily   Continuous: . diltiazem (CARDIZEM) infusion 5 mg/hr (09/16/20 0325)   KDT:OIZTIWPYKDXIP, chlorpheniramine-HYDROcodone, guaiFENesin-dextromethorphan, loperamide, metoprolol tartrate, ondansetron **OR** ondansetron (ZOFRAN) IV   Objective:  Vital Signs  Vitals:   09/16/20 0341 09/16/20 0700 09/16/20 0844 09/16/20 0905  BP: 101/73 110/79    Pulse: 73  (!) 123 93  Resp: (!) 21   20  Temp: 98.2 F (36.8 C) 98.7 F (37.1 C)    TempSrc: Axillary Axillary    SpO2: 96% 96%  98%  Weight:      Height:        Intake/Output Summary (Last 24 hours) at 09/16/2020 0958 Last data filed at 09/16/2020 0859 Gross per 24 hour  Intake 379.17 ml  Output 600 ml  Net -220.83 ml   Filed Weights   Sep 23, 2020 2150 09-23-20 2155 09/11/20 1649  Weight: 78.5 kg 79.4 kg 76.2 kg    General appearance: Awake alert.  In no distress.  Mildly distracted Resp: Negative.  Coarse breath sounds with crackles bilateral bases.  No wheezing or rhonchi. Cardio: S1-S2 is  irregularly irregular.  No S3-S4.  No rubs murmurs or bruit.  Telemetry shows heart rate has been in the 90s to early 100s. GI: Abdomen is soft.  Nontender nondistended.  Bowel sounds are present normal.  No masses organomegaly Extremities: No edema.  Moving all her extremities. Neurologic:  No focal neurological deficits.        Lab Results:  Data Reviewed: I have personally reviewed following labs and imaging studies  CBC: Recent Labs  Lab 09/11/20 0500 09/11/20 0500 09/12/20 0317 09/13/20 0411 09/14/20 0046 09/15/20 0351 09/16/20 0432  WBC 6.1   < > 12.8* 18.0* 17.0* 20.9* 24.1*  NEUTROABS 4.5  --  9.7* 15.0* 14.6* 18.4*  --   HGB 12.7   < > 12.6 13.7 14.8 15.1* 14.1  HCT 36.7   < > 34.9* 37.4 41.6 41.3 40.1  MCV 81.2   < > 77.7* 77.9* 78.8* 77.9* 77.4*  PLT 507*   < > 433* 483* 512* 512* 453*   < > = values in this interval not displayed.    Basic Metabolic Panel: Recent Labs  Lab 09/12/20 0317 09/13/20 0411 09/14/20 0046 09/15/20 0351 09/16/20 0432  NA 144 146* 145 141 140  K 4.4 4.7 5.0 4.1 4.5  CL 112* 115* 114* 109 110  CO2 19* 18* 17* 19* 19*  GLUCOSE 225* 200* 193* 210* 162*  BUN 30* 28* 31* 35* 37*  CREATININE 0.90 0.94 0.79 0.81 0.81  CALCIUM 9.6 9.5 9.2 9.4 9.3  MG 2.2 2.2 2.1 2.0  --     GFR: Estimated Creatinine Clearance: 63.2 mL/min (by C-G formula based on SCr of 0.81 mg/dL).  Liver Function Tests: Recent Labs  Lab Sep 23, 2020 1335 09/12/20 0317 09/13/20 0411 09/14/20 0046 09/15/20 0351  AST 46* 43* 30 25 26   ALT 52* 46* 36 36 33  ALKPHOS 67 61 70 75 74  BILITOT 1.3* 0.8 0.6 0.8 0.5  PROT 7.7 6.8 6.4* 6.3* 7.5  ALBUMIN 3.1* 2.6* 2.2* 2.5* 2.6*     Recent Results (from the past 240 hour(s))  Respiratory Panel by RT PCR (Flu A&B, Covid) - Nasopharyngeal Swab     Status: Abnormal   Collection Time: September 23, 2020  1:36 PM   Specimen: Nasopharyngeal Swab  Result Value Ref Range Status  SARS Coronavirus 2 by RT PCR POSITIVE (A)  NEGATIVE Final    Comment: RESULT CALLED TO, READ BACK BY AND VERIFIED WITH: J,HAVILAND MD @1625  10/04/2020 EB (NOTE) SARS-CoV-2 target nucleic acids are DETECTED.  SARS-CoV-2 RNA is generally detectable in upper respiratory specimens  during the acute phase of infection. Positive results are indicative of the presence of the identified virus, but do not rule out bacterial infection or co-infection with other pathogens not detected by the test. Clinical correlation with patient history and other diagnostic information is necessary to determine patient infection status. The expected result is Negative.  Fact Sheet for Patients:  11/10/20  Fact Sheet for Healthcare Providers: https://www.moore.com/  This test is not yet approved or cleared by the https://www.young.biz/ FDA and  has been authorized for detection and/or diagnosis of SARS-CoV-2 by FDA under an Emergency Use Authorization (EUA).  This EUA will remain in effect (meaning this test can be used ) for the duration of  the COVID-19 declaration under Section 564(b)(1) of the Act, 21 U.S.C. section 360bbb-3(b)(1), unless the authorization is terminated or revoked sooner.      Influenza A by PCR NEGATIVE NEGATIVE Final   Influenza B by PCR NEGATIVE NEGATIVE Final    Comment: (NOTE) The Xpert Xpress SARS-CoV-2/FLU/RSV assay is intended as an aid in  the diagnosis of influenza from Nasopharyngeal swab specimens and  should not be used as a sole basis for treatment. Nasal washings and  aspirates are unacceptable for Xpert Xpress SARS-CoV-2/FLU/RSV  testing.  Fact Sheet for Patients: [place]  Fact Sheet for Healthcare Providers: https://www.moore.com/  This test is not yet approved or cleared by the https://www.young.biz/ FDA and  has been authorized for detection and/or diagnosis of SARS-CoV-2 by  FDA under an Emergency Use Authorization  (EUA). This EUA will remain  in effect (meaning this test can be used) for the duration of the  Covid-19 declaration under Section 564(b)(1) of the Act, 21  U.S.C. section 360bbb-3(b)(1), unless the authorization is  terminated or revoked. Performed at [facility], [address] [place], [place]   Blood Culture (routine x 2)     Status: None   Collection Time: 09/23/2020  1:57 PM   Specimen: BLOOD  Result Value Ref Range Status   Specimen Description BLOOD SITE NOT SPECIFIED  Final   Special Requests   Final    BOTTLES DRAWN AEROBIC AND ANAEROBIC Blood Culture results may not be optimal due to an inadequate volume of blood received in culture bottles   Culture   Final    NO GROWTH 5 DAYS Performed at [facility], [address] [place], [place]    Report Status 09/15/2020 FINAL  Final      Radiology Studies: DG Chest Port 1 View  Result Date: 09/16/2020 CLINICAL DATA:  Pneumonia EXAM: PORTABLE CHEST 1 VIEW COMPARISON:  September 14, 2020 FINDINGS: The cardiomediastinal silhouette is unchanged in contour. No pleural effusion. No pneumothorax. Unchanged diffuse interstitial prominence. Unchanged LEFT greater than RIGHT basilar airspace opacities. Visualized abdomen is unremarkable. Multilevel degenerative changes of the thoracic spine. Advanced degenerative changes of bilateral shoulders. IMPRESSION: Unchanged LEFT greater than RIGHT basilar airspace opacities. Electronically Signed   By: September 16, 2020 MD   On: 09/16/2020 08:11       LOS: 6 days   11/16/2020  [facility] Pager on [email or web address]  09/16/2020, 9:58 AM

## 2020-09-17 DIAGNOSIS — I4891 Unspecified atrial fibrillation: Secondary | ICD-10-CM | POA: Diagnosis not present

## 2020-09-17 DIAGNOSIS — U071 COVID-19: Secondary | ICD-10-CM | POA: Diagnosis not present

## 2020-09-17 DIAGNOSIS — J9601 Acute respiratory failure with hypoxia: Secondary | ICD-10-CM | POA: Diagnosis not present

## 2020-09-17 DIAGNOSIS — I82403 Acute embolism and thrombosis of unspecified deep veins of lower extremity, bilateral: Secondary | ICD-10-CM | POA: Diagnosis not present

## 2020-09-17 LAB — CBC
HCT: 43.8 % (ref 36.0–46.0)
Hemoglobin: 15.4 g/dL — ABNORMAL HIGH (ref 12.0–15.0)
MCH: 28.2 pg (ref 26.0–34.0)
MCHC: 35.2 g/dL (ref 30.0–36.0)
MCV: 80.2 fL (ref 80.0–100.0)
Platelets: 356 10*3/uL (ref 150–400)
RBC: 5.46 MIL/uL — ABNORMAL HIGH (ref 3.87–5.11)
RDW: 12.8 % (ref 11.5–15.5)
WBC: 18.9 10*3/uL — ABNORMAL HIGH (ref 4.0–10.5)
nRBC: 0.2 % (ref 0.0–0.2)

## 2020-09-17 LAB — GLUCOSE, CAPILLARY
Glucose-Capillary: 176 mg/dL — ABNORMAL HIGH (ref 70–99)
Glucose-Capillary: 219 mg/dL — ABNORMAL HIGH (ref 70–99)
Glucose-Capillary: 198 mg/dL — ABNORMAL HIGH (ref 70–99)
Glucose-Capillary: 246 mg/dL — ABNORMAL HIGH (ref 70–99)

## 2020-09-17 LAB — BASIC METABOLIC PANEL
Anion gap: 11 (ref 5–15)
BUN: 40 mg/dL — ABNORMAL HIGH (ref 8–23)
CO2: 22 mmol/L (ref 22–32)
Calcium: 9.2 mg/dL (ref 8.9–10.3)
Chloride: 104 mmol/L (ref 98–111)
Creatinine, Ser: 1.01 mg/dL — ABNORMAL HIGH (ref 0.44–1.00)
GFR calc non Af Amer: 55 mL/min — ABNORMAL LOW (ref >60)
Glucose, Bld: 166 mg/dL — ABNORMAL HIGH (ref 70–99)
Potassium: 4.3 mmol/L (ref 3.5–5.1)
Sodium: 137 mmol/L (ref 135–145)

## 2020-09-17 LAB — D-DIMER, QUANTITATIVE: D-Dimer, Quant: 14.06 ug/mL-FEU — ABNORMAL HIGH (ref 0.00–0.50)

## 2020-09-17 MED ORDER — PHENOL 1.4 % MT LIQD
1.0000 | OROMUCOSAL | Status: DC | PRN
  Filled 2020-09-17: qty 177

## 2020-09-17 MED ORDER — SALINE SPRAY 0.65 % NA SOLN
1.0000 | NASAL | Status: DC | PRN
  Administered 2020-09-17 – 2020-10-04 (×4): 1 via NASAL
  Filled 2020-09-17: qty 44

## 2020-09-17 MED ORDER — METOPROLOL TARTRATE 25 MG PO TABS
25.0000 mg | ORAL_TABLET | Freq: Two times a day (BID) | ORAL | Status: DC
  Administered 2020-09-17: 25 mg via ORAL
  Filled 2020-09-17: qty 1

## 2020-09-17 MED ORDER — LIP MEDEX EX OINT
TOPICAL_OINTMENT | CUTANEOUS | Status: DC | PRN
  Filled 2020-09-17 (×2): qty 7

## 2020-09-17 MED ORDER — BARICITINIB 2 MG PO TABS
2.0000 mg | ORAL_TABLET | Freq: Every day | ORAL | Status: AC
  Administered 2020-09-18 – 2020-09-23 (×6): 2 mg via ORAL
  Filled 2020-09-17 (×7): qty 1

## 2020-09-17 NOTE — Plan of Care (Signed)
  Problem: Education: Goal: Knowledge of General Education information will improve Description: Including pain rating scale, medication(s)/side effects and non-pharmacologic comfort measures Outcome: Progressing   Problem: Health Behavior/Discharge Planning: Goal: Ability to manage health-related needs will improve Outcome: Progressing   Problem: Clinical Measurements: Goal: Ability to maintain clinical measurements within normal limits will improve Outcome: Progressing Goal: Will remain free from infection Outcome: Progressing Goal: Diagnostic test results will improve Outcome: Progressing Goal: Respiratory complications will improve Outcome: Progressing Goal: Cardiovascular complication will be avoided Outcome: Progressing   Problem: Activity: Goal: Risk for activity intolerance will decrease Outcome: Progressing   Problem: Nutrition: Goal: Adequate nutrition will be maintained Outcome: Progressing   Problem: Coping: Goal: Level of anxiety will decrease Outcome: Progressing   Problem: Elimination: Goal: Will not experience complications related to bowel motility Outcome: Progressing Goal: Will not experience complications related to urinary retention Outcome: Progressing   Problem: Pain Managment: Goal: General experience of comfort will improve Outcome: Progressing   Problem: Safety: Goal: Ability to remain free from injury will improve Outcome: Progressing   Problem: Skin Integrity: Goal: Risk for impaired skin integrity will decrease Outcome: Progressing   Problem: Education: Goal: Knowledge of risk factors and measures for prevention of condition will improve Outcome: Progressing   Problem: Coping: Goal: Psychosocial and spiritual needs will be supported Outcome: Progressing   Problem: Respiratory: Goal: Will maintain a patent airway Outcome: Progressing Goal: Complications related to the disease process, condition or treatment will be avoided or  minimized Outcome: Progressing   Problem: Education: Goal: Knowledge of disease or condition will improve Outcome: Progressing Goal: Understanding of medication regimen will improve Outcome: Progressing Goal: Individualized Educational Video(s) Outcome: Progressing   Problem: Activity: Goal: Ability to tolerate increased activity will improve Outcome: Progressing   Problem: Cardiac: Goal: Ability to achieve and maintain adequate cardiopulmonary perfusion will improve Outcome: Progressing   Problem: Health Behavior/Discharge Planning: Goal: Ability to safely manage health-related needs after discharge will improve Outcome: Progressing   

## 2020-09-17 NOTE — Progress Notes (Signed)
PROGRESS NOTE  Hannah Torres YNW:[id number] DOB: 02-24-1946 DOA: 09/17/2020  PCP: Pcp, No  Brief History/Interval Summary: 74 y.o. female with medical history significant of hypertension, former smoker presented to emergency department with worsening shortness of breath since 4 days.  She is not vaccinated against COVID-19.  Patient was noted to be hypoxic in the emergency department.  Chest x-ray showed bilateral opacities.  Patient was hospitalized for further management.  Reason for Visit: Pneumonia due to COVID-19.  Acute respiratory failure with hypoxia  Consultants: None  Procedures: None  Antibiotics: Anti-infectives (From admission, onward)   Start     Dose/Rate Route Frequency Ordered Stop   09/11/20 1000  remdesivir 100 mg in sodium chloride 0.9 % 100 mL IVPB       "Followed by" Linked Group Details   100 mg 200 mL/hr over 30 Minutes Intravenous Daily 09/17/2020 1815 09/14/20 1134   September 17, 2020 2000  remdesivir 200 mg in sodium chloride 0.9% 250 mL IVPB       "Followed by" Linked Group Details   200 mg 580 mL/hr over 30 Minutes Intravenous Once 17-Sep-2020 1815 2020-09-17 2228   09-17-2020 1530  cefTRIAXone (ROCEPHIN) 2 g in sodium chloride 0.9 % 100 mL IVPB  Status:  Discontinued        2 g 200 mL/hr over 30 Minutes Intravenous Every 24 hours 09-17-20 1517 09/11/20 0704   2020-09-17 1530  azithromycin (ZITHROMAX) 500 mg in sodium chloride 0.9 % 250 mL IVPB  Status:  Discontinued        500 mg 250 mL/hr over 60 Minutes Intravenous Every 24 hours 09/17/2020 1517 09/11/20 0704      Subjective/Interval History: No overnight issues reported by nursing staff.  Patient states that she is feeling slightly better with respect to her breathing.  Denies any chest pain.  No lightheadedness or dizziness.  Assessment/Plan:  Acute Hypoxic Resp. Failure/Pneumonia due to COVID-19   Recent Labs  Lab  0000 2020/09/17 1335 Sep 17, 2020 1420 09/11/20 0500 09/11/20 0833 09/12/20 0317  09/13/20 0411 09/14/20 0046 09/15/20 0351 09/17/20 0300  DDIMER  --  3.17*  --    < >  --  >20.00* >20.00* >20.00* >20.00* 14.06*  FERRITIN  --  1,946*  --   --  1,978*  --   --   --   --   --   CRP   < > 18.8*  --   --  20.6* 13.0* 5.8* 3.1* 1.4*  --   ALT  --  52*  --   --   --  46* 36 36 33  --   PROCALCITON  --   --  <0.10  --   --   --   --   --   --   --    < > = values in this interval not displayed.    Objective findings: Oxygen requirements: She remains on high flow nasal cannula at 8 L/min along with a nonrebreather.  Saturating in the early 90s.    COVID 19 Therapeutics: Antibacterials: Antibacterials were discontinued. Remdesivir: Completed 5-day course on 10/5. Steroids: Solu-Medrol Diuretics: Lasix given on 10/4 and 10/5.  Held on 10/6.  Given on 10/7.  Will hold today.   Inhaled Steroids:  Dulera Baricitinib: Initiated 10/1 after discussions of risks and benefits PUD Prophylaxis: Pepcid DVT Prophylaxis:  on therapeutic Lovenox due to A. fib and DVT  From a respiratory standpoint patient remains tenuous though seems to be slightly better today than yesterday.  Continue current management including steroids and baricitinib.  She has completed course of Remdesivir.  D-dimer had been greater than 20 for many days and noted to be better at 14 today.  Continue to trend.  CRP had improved to 1.4.  LFTs have improved.  Hold off on doses of furosemide today due to borderline low blood pressure.  Chest x-ray from yesterday showed stable findings .  Patient encouraged to stay in prone position as much as possible.  Incentive spirometer.  Mobilization.  The treatment plan and use of medications and known side effects were discussed with patient/family. Some of the medications used are based on case reports/anecdotal data.  All other medications being used in the management of COVID-19 based on limited study data.  Complete risks and long-term side effects are unknown, however in the  best clinical judgment they seem to be of some benefit.  Patient/family wanted to proceed with treatment options provided.  Acute DVT bilateral lower extremities/presumed PE Patient found to have a bilateral lower extremity DVT.  Likely has acute PE as well though CT scan was not done due to her tenuous respiratory status.   Continue with therapeutic Lovenox for now.  Transition to oral agents when she is more stable.  D-dimer finally improving.  Atrial fibrillation with RVR No previous history of same.   Echocardiogram showed normal systolic function.  No valvular abnormalities of concern noted. Atrial fibrillation likely triggered by her respiratory illness.  She briefly transition to sinus rhythm with metoprolol but then went back into atrial fibrillation. Due to poorly controlled heart rate patient was started on a Cardizem infusion.   Heart rate has been stable for the last 24 hours in the 90s.  Occasionally in the 70s and 80s.  Continue diltiazem infusion for now.  Transition to oral diltiazem when she is better from respiratory standpoint.  Continue therapeutic Lovenox.   See below regarding thyroid function test.   Hold dose of metoprolol this morning due to borderline low blood pressures.  Hyperglycemia No known history of diabetes.  HbA1c 6.4.  Hyperglycemia likely due to steroids.  Continue low-dose Lantus and SSI.  Abnormal thyroid function tests TSH noted to be 0.157.  Free T4 noted to be 1.22 which is only slightly above the normal range.  She does not have any other symptoms suggestive of hyperthyroidism.  Recommend rechecking her thyroid function tests in 2 to 3 weeks.  Essential hypertension Blood pressure noted to be borderline low.  Will decrease the dose of metoprolol further.  Hold the dose this morning.  Acute kidney injury Baseline kidney function is not known.  BUN and creatinine was noted to be elevated.  She was given IV fluids.  Renal function has resolved.   Continue to monitor.  Avoid nephrotoxic agents.  Slight increase in creatinine this morning is likely due to Lasix given the last few days.  Thrombocytosis/leukocytosis Likely reactive due to acute infection.  Elevated WBC likely due to steroids.  Platelet counts have improved.  Transaminitis Secondary to COVID-19.  Now resolved.  DVT Prophylaxis: On therapeutic Lovenox Code Status: Full code Family Communication: Daughter being updated daily Disposition Plan: Hopefully return home when improved  Status is: Inpatient  Remains inpatient appropriate because:IV treatments appropriate due to intensity of illness or inability to take PO and Inpatient level of care appropriate due to severity of illness   Dispo:  Patient From: Home  Planned Disposition: Home  Expected discharge date: 09/20/20  Medically stable for discharge: No  Medications:  Scheduled: . vitamin C  500 mg Oral Daily  . [START ON 09/18/2020] baricitinib  2 mg Oral Daily  . enoxaparin (LOVENOX) injection  1 mg/kg Subcutaneous Q12H  . famotidine  20 mg Oral Daily  . influenza vaccine adjuvanted  0.5 mL Intramuscular Tomorrow-1000  . insulin aspart  0-20 Units Subcutaneous TID WC  . insulin aspart  0-5 Units Subcutaneous QHS  . insulin glargine  8 Units Subcutaneous QHS  . methylPREDNISolone (SOLU-MEDROL) injection  80 mg Intravenous Q12H  . metoprolol tartrate  50 mg Oral BID  . mometasone-formoterol  2 puff Inhalation BID  . zinc sulfate  220 mg Oral Daily   Continuous: . diltiazem (CARDIZEM) infusion 10 mg/hr (09/17/20 1120)   MKL:KJZPHXTAVWPVX, chlorpheniramine-HYDROcodone, guaiFENesin-dextromethorphan, loperamide, metoprolol tartrate, ondansetron **OR** ondansetron (ZOFRAN) IV   Objective:  Vital Signs  Vitals:   09/17/20 0922 09/17/20 0934 09/17/20 1119 09/17/20 1134  BP: (!) 98/47 102/84 112/68 101/72  Pulse:  84 (!) 128   Resp: 20 19 20 20   Temp:      TempSrc:      SpO2: 94% 95% 90%    Weight:      Height:        Intake/Output Summary (Last 24 hours) at 09/17/2020 1143 Last data filed at 09/17/2020 0320 Gross per 24 hour  Intake 277.97 ml  Output 1300 ml  Net -1022.03 ml   Filed Weights   September 25, 2020 2150 September 25, 2020 2155 09/11/20 1649  Weight: 78.5 kg 79.4 kg 76.2 kg    General appearance: Awake alert.  In no distress.  Mildly distracted Resp: Normal effort.  Few crackles bilateral bases.  No wheezing or rhonchi.  Coarse breath sounds bilaterally. Cardio: S1-S2 is irregularly irregular.  Telemetry reviewed.  Heart rate has been less than 100 last 18 hours or so. GI: Abdomen is soft.  Nontender nondistended.  Bowel sounds are present normal.  No masses organomegaly Extremities: No edema.   Neurologic:No focal neurological deficits.      Lab Results:  Data Reviewed: I have personally reviewed following labs and imaging studies  CBC: Recent Labs  Lab 09/11/20 0500 09/11/20 0500 09/12/20 0317 09/12/20 0317 09/13/20 0411 09/14/20 0046 09/15/20 0351 09/16/20 0432 09/17/20 0300  WBC 6.1   < > 12.8*   < > 18.0* 17.0* 20.9* 24.1* 18.9*  NEUTROABS 4.5  --  9.7*  --  15.0* 14.6* 18.4*  --   --   HGB 12.7   < > 12.6   < > 13.7 14.8 15.1* 14.1 15.4*  HCT 36.7   < > 34.9*   < > 37.4 41.6 41.3 40.1 43.8  MCV 81.2   < > 77.7*   < > 77.9* 78.8* 77.9* 77.4* 80.2  PLT 507*   < > 433*   < > 483* 512* 512* 453* 356   < > = values in this interval not displayed.    Basic Metabolic Panel: Recent Labs  Lab 09/12/20 0317 09/12/20 0317 09/13/20 0411 09/14/20 0046 09/15/20 0351 09/16/20 0432 09/17/20 0300  NA 144   < > 146* 145 141 140 137  K 4.4   < > 4.7 5.0 4.1 4.5 4.3  CL 112*   < > 115* 114* 109 110 104  CO2 19*   < > 18* 17* 19* 19* 22  GLUCOSE 225*   < > 200* 193* 210* 162* 166*  BUN 30*   < > 28* 31* 35* 37* 40*  CREATININE 0.90   < >  0.94 0.79 0.81 0.81 1.01*  CALCIUM 9.6   < > 9.5 9.2 9.4 9.3 9.2  MG 2.2  --  2.2 2.1 2.0  --   --    < > = values in  this interval not displayed.    GFR: Estimated Creatinine Clearance: 50.7 mL/min (A) (by C-G formula based on SCr of 1.01 mg/dL (H)).  Liver Function Tests: Recent Labs  Lab Sep 26, 2020 1335 09/12/20 0317 09/13/20 0411 09/14/20 0046 09/15/20 0351  AST 46* 43* 30 25 26   ALT 52* 46* 36 36 33  ALKPHOS 67 61 70 75 74  BILITOT 1.3* 0.8 0.6 0.8 0.5  PROT 7.7 6.8 6.4* 6.3* 7.5  ALBUMIN 3.1* 2.6* 2.2* 2.5* 2.6*     Recent Results (from the past 240 hour(s))  Respiratory Panel by RT PCR (Flu A&B, Covid) - Nasopharyngeal Swab     Status: Abnormal   Collection Time: 2020/09/26  1:36 PM   Specimen: Nasopharyngeal Swab  Result Value Ref Range Status   SARS Coronavirus 2 by RT PCR POSITIVE (A) NEGATIVE Final    Comment: RESULT CALLED TO, READ BACK BY AND VERIFIED WITH: J,HAVILAND MD @1625  2020/09/26 EB (NOTE) SARS-CoV-2 target nucleic acids are DETECTED.  SARS-CoV-2 RNA is generally detectable in upper respiratory specimens  during the acute phase of infection. Positive results are indicative of the presence of the identified virus, but do not rule out bacterial infection or co-infection with other pathogens not detected by the test. Clinical correlation with patient history and other diagnostic information is necessary to determine patient infection status. The expected result is Negative.  Fact Sheet for Patients:   Fact Sheet for Healthcare Providers: 11/10/20  This test is not yet approved or cleared by the https://www.moore.com/ FDA and  has been authorized for detection and/or diagnosis of SARS-CoV-2 by FDA under an Emergency Use Authorization (EUA).  This EUA will remain in effect (meaning this test can be used ) for the duration of  the COVID-19 declaration under Section 564(b)(1) of the Act, 21 U.S.C. section 360bbb-3(b)(1), unless the authorization is terminated or revoked sooner.      Influenza A by PCR  NEGATIVE NEGATIVE Final   Influenza B by PCR NEGATIVE NEGATIVE Final    Comment: (NOTE) The Xpert Xpress SARS-CoV-2/FLU/RSV assay is intended as an aid in  the diagnosis of influenza from Nasopharyngeal swab specimens and  should not be used as a sole basis for treatment. Nasal washings and  aspirates are unacceptable for Xpert Xpress SARS-CoV-2/FLU/RSV  testing.  Fact Sheet for Patients: https://www.young.biz/  Fact Sheet for Healthcare Providers: [place]  This test is not yet approved or cleared by the https://www.moore.com/ FDA and  has been authorized for detection and/or diagnosis of SARS-CoV-2 by  FDA under an Emergency Use Authorization (EUA). This EUA will remain  in effect (meaning this test can be used) for the duration of the  Covid-19 declaration under Section 564(b)(1) of the Act, 21  U.S.C. section 360bbb-3(b)(1), unless the authorization is  terminated or revoked. Performed at [facility], [address], [place], [address] [place]   Blood Culture (routine x 2)     Status: None   Collection Time: 09-26-2020  1:57 PM   Specimen: BLOOD  Result Value Ref Range Status   Specimen Description BLOOD SITE NOT SPECIFIED  Final   Special Requests   Final    BOTTLES DRAWN AEROBIC AND ANAEROBIC Blood Culture results may not be optimal due to an  inadequate volume of blood received in culture bottles   Culture   Final    NO GROWTH 5 DAYS Performed at [facility], [address], [place], [place] [address]    Report Status 09/15/2020 FINAL  Final      Radiology Studies: DG Chest Port 1 View  Result Date: 09/16/2020 CLINICAL DATA:  Pneumonia EXAM: PORTABLE CHEST 1 VIEW COMPARISON:  September 14, 2020 FINDINGS: The cardiomediastinal silhouette is unchanged in contour. No pleural effusion. No pneumothorax. Unchanged diffuse interstitial prominence. Unchanged LEFT greater than RIGHT basilar airspace opacities. Visualized  abdomen is unremarkable. Multilevel degenerative changes of the thoracic spine. Advanced degenerative changes of bilateral shoulders. IMPRESSION: Unchanged LEFT greater than RIGHT basilar airspace opacities. Electronically Signed   By: Meda Klinefelter MD   On: 09/16/2020 08:11       LOS: 7 days   Osvaldo Shipper  [facility] Pager on [email or web address]  09/17/2020, 11:43 AM

## 2020-09-18 DIAGNOSIS — U071 COVID-19: Secondary | ICD-10-CM | POA: Diagnosis not present

## 2020-09-18 DIAGNOSIS — J9601 Acute respiratory failure with hypoxia: Secondary | ICD-10-CM | POA: Diagnosis not present

## 2020-09-18 LAB — BRAIN NATRIURETIC PEPTIDE: B Natriuretic Peptide: 57.9 pg/mL (ref 0.0–100.0)

## 2020-09-18 LAB — GLUCOSE, CAPILLARY
Glucose-Capillary: 209 mg/dL — ABNORMAL HIGH (ref 70–99)
Glucose-Capillary: 149 mg/dL — ABNORMAL HIGH (ref 70–99)
Glucose-Capillary: 189 mg/dL — ABNORMAL HIGH (ref 70–99)
Glucose-Capillary: 203 mg/dL — ABNORMAL HIGH (ref 70–99)

## 2020-09-18 LAB — D-DIMER, QUANTITATIVE: D-Dimer, Quant: 12.07 ug/mL-FEU — ABNORMAL HIGH (ref 0.00–0.50)

## 2020-09-18 LAB — BASIC METABOLIC PANEL
Anion gap: 12 (ref 5–15)
BUN: 40 mg/dL — ABNORMAL HIGH (ref 8–23)
CO2: 21 mmol/L — ABNORMAL LOW (ref 22–32)
Calcium: 9.1 mg/dL (ref 8.9–10.3)
Chloride: 103 mmol/L (ref 98–111)
Creatinine, Ser: 0.96 mg/dL (ref 0.44–1.00)
GFR, Estimated: 59 mL/min — ABNORMAL LOW (ref >60)
Glucose, Bld: 151 mg/dL — ABNORMAL HIGH (ref 70–99)
Potassium: 4 mmol/L (ref 3.5–5.1)
Sodium: 136 mmol/L (ref 135–145)

## 2020-09-18 LAB — CBC
HCT: 40.8 % (ref 36.0–46.0)
Hemoglobin: 14.7 g/dL (ref 12.0–15.0)
MCH: 28.2 pg (ref 26.0–34.0)
MCHC: 36 g/dL (ref 30.0–36.0)
MCV: 78.3 fL — ABNORMAL LOW (ref 80.0–100.0)
Platelets: 373 10*3/uL (ref 150–400)
RBC: 5.21 MIL/uL — ABNORMAL HIGH (ref 3.87–5.11)
RDW: 12.4 % (ref 11.5–15.5)
WBC: 16.1 10*3/uL — ABNORMAL HIGH (ref 4.0–10.5)
nRBC: 0.2 % (ref 0.0–0.2)

## 2020-09-18 LAB — MAGNESIUM: Magnesium: 2.3 mg/dL (ref 1.7–2.4)

## 2020-09-18 LAB — T4, FREE: Free T4: 1.06 ng/dL (ref 0.61–1.12)

## 2020-09-18 LAB — TSH: TSH: 0.183 u[IU]/mL — ABNORMAL LOW (ref 0.350–4.500)

## 2020-09-18 MED ORDER — DILTIAZEM HCL 25 MG/5ML IV SOLN
10.0000 mg | Freq: Four times a day (QID) | INTRAVENOUS | Status: DC | PRN
  Administered 2020-10-05: 10 mg via INTRAVENOUS
  Filled 2020-09-18 (×3): qty 5

## 2020-09-18 MED ORDER — METOPROLOL TARTRATE 12.5 MG HALF TABLET
50.0000 mg | ORAL_TABLET | Freq: Two times a day (BID) | ORAL | Status: DC
  Administered 2020-09-18 – 2020-10-04 (×34): 50 mg via ORAL
  Filled 2020-09-18 (×34): qty 1

## 2020-09-18 MED ORDER — DILTIAZEM HCL 60 MG PO TABS: 60.0000 mg | ORAL_TABLET | Freq: Three times a day (TID) | ORAL | Status: DC

## 2020-09-18 MED ORDER — METHYLPREDNISOLONE SODIUM SUCC 125 MG IJ SOLR
60.0000 mg | Freq: Two times a day (BID) | INTRAMUSCULAR | Status: DC
  Administered 2020-09-18 – 2020-09-19 (×4): 60 mg via INTRAVENOUS
  Filled 2020-09-18 (×4): qty 2

## 2020-09-18 NOTE — Progress Notes (Signed)
Notified Dr. Leafy Half oc call for Hospitalist regarding patient has 2.37 second pause while patient asleep, patient on Cardizem drip that is now on 5mg /hr. Cardizem drip was increased to 15mg /hr during the start of the shift as patient HR was in the 130's while up in the chair. Patient was asymptomatic with the pause, no complaint of discomfort.

## 2020-09-18 NOTE — Progress Notes (Signed)
PROGRESS NOTE  Hannah Torres VZC:[id number] DOB: [birth year] DOA: 09/19/2020  PCP: Pcp, No  Brief History/Interval Summary: 74 y.o. female with medical history significant of hypertension, former smoker presented to emergency department with worsening shortness of breath since 4 days.  She is not vaccinated against COVID-19.  Patient was noted to be hypoxic in the emergency department.  Chest x-ray showed bilateral opacities.  Patient was hospitalized for further management.  Reason for Visit: Pneumonia due to COVID-19.  Acute respiratory failure with hypoxia  Consultants: None  Procedures: None  Antibiotics: Anti-infectives (From admission, onward)   Start     Dose/Rate Route Frequency Ordered Stop   09/11/20 1000  remdesivir 100 mg in sodium chloride 0.9 % 100 mL IVPB       "Followed by" Linked Group Details   100 mg 200 mL/hr over 30 Minutes Intravenous Daily 10/06/2020 1815 09/14/20 1134   Oct 05, 2020 2000  remdesivir 200 mg in sodium chloride 0.9% 250 mL IVPB       "Followed by" Linked Group Details   200 mg 580 mL/hr over 30 Minutes Intravenous Once 09/27/2020 1815 05-Oct-2020 2228   October 05, 2020 1530  cefTRIAXone (ROCEPHIN) 2 g in sodium chloride 0.9 % 100 mL IVPB  Status:  Discontinued        2 g 200 mL/hr over 30 Minutes Intravenous Every 24 hours 05-Oct-2020 1517 09/11/20 0704   05-Oct-2020 1530  azithromycin (ZITHROMAX) 500 mg in sodium chloride 0.9 % 250 mL IVPB  Status:  Discontinued        500 mg 250 mL/hr over 60 Minutes Intravenous Every 24 hours 09/16/2020 1517 09/11/20 0704      Subjective/Interval History:  Patient in bed, appears comfortable, denies any headache, no fever, no chest pain or pressure, no shortness of breath , no abdominal pain. No focal weakness.   Assessment/Plan:  Acute Hypoxic Resp. Failure/Pneumonia due to COVID-19 - She is unfortunately not vaccinated for Covid and has incurred severe parenchymal lung injury due to COVID-19 pneumonia, she has been treated  with IV steroids, Remdesivir and Baricitinib.  Continue to monitor closely still very tenuous.  Encouraged the patient to sit up in chair use I-S and flutter valve in the daytime for pulmonary toiletry.    Recent Labs  Lab 09/12/20 0317 09/12/20 0317 09/13/20 0411 09/13/20 0411 09/14/20 0046 09/15/20 0351 09/16/20 0432 09/17/20 0300 09/18/20 0211 09/18/20 0901  WBC 12.8*   < > 18.0*   < > 17.0* 20.9* 24.1* 18.9* 16.1*  --   CRP 13.0*  --  5.8*  --  3.1* 1.4*  --   --   --   --   DDIMER >20.00*   < > >20.00*  --  >20.00* >20.00*  --  14.06* 12.07*  --   BNP  --   --   --   --   --   --  89.9  --   --  57.9  AST 43*  --  30  --  25 26  --   --   --   --   ALT 46*  --  36  --  36 33  --   --   --   --   ALKPHOS 61  --  70  --  75 74  --   --   --   --   BILITOT 0.8  --  0.6  --  0.8 0.5  --   --   --   --  ALBUMIN 2.6*  --  2.2*  --  2.5* 2.6*  --   --   --   --    < > = values in this interval not displayed.        Acute DVT bilateral lower extremities/presumed PE -  Patient found to have a bilateral lower extremity DVT.  Likely has acute PE as well though CT scan was not done due to her tenuous respiratory status.  Continue with therapeutic Lovenox for now.  Transition to oral agents when she is more stable.  D-dimer finally improving.  Atrial fibrillation with RVR with [place] vas 2 score of greater than 3. No previous history of same.  Stable echocardiogram and TSH, currently on Cardizem infusion, will be transition to high-dose oral beta-blocker along with full dose Lovenox.   Abnormal thyroid function tests TSH noted to be 0.157.  Free T4 noted to be 1.22 which is only slightly above the normal range.  Repeat due to Afib.  Lab Results  Component Value Date   TSH 0.157 (L) 09/13/2020     Essential hypertension B.Blocker.  Acute kidney injury Baseline kidney function is not known.  BUN and creatinine was noted to be elevated.  She was given IV fluids.  Renal function has  resolved.  Continue to monitor.  Avoid nephrotoxic agents.  Slight increase in creatinine this morning is likely due to Lasix given the last few days.  Thrombocytosis/leukocytosis Likely reactive due to acute infection.  Elevated WBC likely due to steroids.  Platelet counts have improved.  Transaminitis Secondary to COVID-19.  Now resolved.  Hyperglycemia No known history of diabetes.  HbA1c 6.4.  Hyperglycemia likely due to steroids.  Continue low-dose Lantus and SSI.  CBG (last 3)  Recent Labs    09/17/20 1624 09/17/20 2134 09/18/20 0724  GLUCAP 198* 246* 209*     DVT Prophylaxis: On therapeutic Lovenox Code Status: Full code Family Communication: Daughter being updated daily Disposition Plan: Hopefully return home when improved  Status is: Inpatient  Remains inpatient appropriate because:IV treatments appropriate due to intensity of illness or inability to take PO and Inpatient level of care appropriate due to severity of illness   Dispo:  Patient From: Home  Planned Disposition: Home  Expected discharge date: 09/20/20  Medically stable for discharge: No     Medications:  Scheduled: . vitamin C  500 mg Oral Daily  . baricitinib  2 mg Oral Daily  . enoxaparin (LOVENOX) injection  1 mg/kg Subcutaneous Q12H  . famotidine  20 mg Oral Daily  . influenza vaccine adjuvanted  0.5 mL Intramuscular Tomorrow-1000  . insulin aspart  0-20 Units Subcutaneous TID WC  . insulin aspart  0-5 Units Subcutaneous QHS  . insulin glargine  8 Units Subcutaneous QHS  . methylPREDNISolone (SOLU-MEDROL) injection  60 mg Intravenous Q12H  . metoprolol tartrate  50 mg Oral BID  . mometasone-formoterol  2 puff Inhalation BID  . zinc sulfate  220 mg Oral Daily   Continuous: . diltiazem (CARDIZEM) infusion 2.5 mg/hr (09/18/20 0824)   NLZ:JQBHALPFXTKWI, chlorpheniramine-HYDROcodone, diltiazem, guaiFENesin-dextromethorphan, lip balm, loperamide, ondansetron **OR** ondansetron (ZOFRAN) IV,  phenol, sodium chloride   Objective:  Vital Signs  Vitals:   09/18/20 0000 09/18/20 0100 09/18/20 0402 09/18/20 0727  BP: (!) 106/95  113/66 122/74  Pulse:   80 90  Resp: (!) 24 (!) 25 (!) 22 (!) 31  Temp:   98.1 F (36.7 C) 97.6 F (36.4 C)  TempSrc:   Axillary Oral  SpO2: 100% 99% 99% (!) 88%  Weight:      Height:        Intake/Output Summary (Last 24 hours) at 09/18/2020 1135 Last data filed at 09/17/2020 2300 Gross per 24 hour  Intake 646.1 ml  Output 700 ml  Net -53.9 ml   Filed Weights   09-11-2020 2150 2020/09/11 2155 09/11/20 1649  Weight: 78.5 kg 79.4 kg 76.2 kg    Exam   ( today examined and inteviwed by Dr Randol Kern)   Awake Alert, No new F.N deficits, Normal affect [place].AT,PERRAL Supple Neck,No JVD, No cervical lymphadenopathy appriciated.  Symmetrical Chest wall movement, Good air movement bilaterally, CTAB RRR,No Gallops, Rubs or new Murmurs, No Parasternal Heave +ve B.Sounds, Abd Soft, No tenderness, No organomegaly appriciated, No rebound - guarding or rigidity. No Cyanosis, Clubbing or edema, No new Rash or bruise      Lab Results:  Data Reviewed: I have personally reviewed following labs and imaging studies  CBC: Recent Labs  Lab 09/12/20 0317 09/12/20 0317 09/13/20 0411 09/13/20 0411 09/14/20 0046 09/15/20 0351 09/16/20 0432 09/17/20 0300 09/18/20 0211  WBC 12.8*   < > 18.0*   < > 17.0* 20.9* 24.1* 18.9* 16.1*  NEUTROABS 9.7*  --  15.0*  --  14.6* 18.4*  --   --   --   HGB 12.6   < > 13.7   < > 14.8 15.1* 14.1 15.4* 14.7  HCT 34.9*   < > 37.4   < > 41.6 41.3 40.1 43.8 40.8  MCV 77.7*   < > 77.9*   < > 78.8* 77.9* 77.4* 80.2 78.3*  PLT 433*   < > 483*   < > 512* 512* 453* 356 373   < > = values in this interval not displayed.    Basic Metabolic Panel: Recent Labs  Lab 09/12/20 0317 09/12/20 0317 09/13/20 0411 09/13/20 0411 09/14/20 0046 09/15/20 0351 09/16/20 0432 09/17/20 0300 09/18/20 0211 09/18/20 0901  NA 144   < >  146*   < > 145 141 140 137 136  --   K 4.4   < > 4.7   < > 5.0 4.1 4.5 4.3 4.0  --   CL 112*   < > 115*   < > 114* 109 110 104 103  --   CO2 19*   < > 18*   < > 17* 19* 19* 22 21*  --   GLUCOSE 225*   < > 200*   < > 193* 210* 162* 166* 151*  --   BUN 30*   < > 28*   < > 31* 35* 37* 40* 40*  --   CREATININE 0.90   < > 0.94   < > 0.79 0.81 0.81 1.01* 0.96  --   CALCIUM 9.6   < > 9.5   < > 9.2 9.4 9.3 9.2 9.1  --   MG 2.2  --  2.2  --  2.1 2.0  --   --   --  2.3   < > = values in this interval not displayed.    GFR: Estimated Creatinine Clearance: 53.3 mL/min (by C-G formula based on SCr of 0.96 mg/dL).  Liver Function Tests: Recent Labs  Lab 09/12/20 0317 09/13/20 0411 09/14/20 0046 09/15/20 0351  AST 43* 30 25 26   ALT 46* 36 36 33  ALKPHOS 61 70 75 74  BILITOT 0.8 0.6 0.8 0.5  PROT 6.8 6.4* 6.3* 7.5  ALBUMIN 2.6* 2.2* 2.5*  2.6*     Recent Results (from the past 240 hour(s))  Respiratory Panel by RT PCR (Flu A&B, Covid) - Nasopharyngeal Swab     Status: Abnormal   Collection Time: 09/23/2020  1:36 PM   Specimen: Nasopharyngeal Swab  Result Value Ref Range Status   SARS Coronavirus 2 by RT PCR POSITIVE (A) NEGATIVE Final    Comment: RESULT CALLED TO, READ BACK BY AND VERIFIED WITH: J,HAVILAND MD @1625  09/11/2020 EB (NOTE) SARS-CoV-2 target nucleic acids are DETECTED.  SARS-CoV-2 RNA is generally detectable in upper respiratory specimens  during the acute phase of infection. Positive results are indicative of the presence of the identified virus, but do not rule out bacterial infection or co-infection with other pathogens not detected by the test. Clinical correlation with patient history and other diagnostic information is necessary to determine patient infection status. The expected result is Negative.  Fact Sheet for Patients:  https://www.moore.com/https://www.fda.gov/media/142436/download  Fact Sheet for Healthcare Providers: https://www.young.biz/https://www.fda.gov/media/142435/download  This test is not  yet approved or cleared by the [place] FDA and  has been authorized for detection and/or diagnosis of SARS-CoV-2 by FDA under an Emergency Use Authorization (EUA).  This EUA will remain in effect (meaning this test can be used ) for the duration of  the COVID-19 declaration under Section 564(b)(1) of the Act, 21 U.S.C. section 360bbb-3(b)(1), unless the authorization is terminated or revoked sooner.      Influenza A by PCR NEGATIVE NEGATIVE Final   Influenza B by PCR NEGATIVE NEGATIVE Final    Comment: (NOTE) The Xpert Xpress SARS-CoV-2/FLU/RSV assay is intended as an aid in  the diagnosis of influenza from Nasopharyngeal swab specimens and  should not be used as a sole basis for treatment. Nasal washings and  aspirates are unacceptable for Xpert Xpress SARS-CoV-2/FLU/RSV  testing.  Fact Sheet for Patients: https://www.moore.com/https://www.fda.gov/media/142436/download  Fact Sheet for Healthcare Providers: https://www.young.biz/https://www.fda.gov/media/142435/download  This test is not yet approved or cleared by the [place] FDA and  has been authorized for detection and/or diagnosis of SARS-CoV-2 by  FDA under an Emergency Use Authorization (EUA). This EUA will remain  in effect (meaning this test can be used) for the duration of the  Covid-19 declaration under Section 564(b)(1) of the Act, 21  U.S.C. section 360bbb-3(b)(1), unless the authorization is  terminated or revoked. Performed at [facility], [address] [place], [place] [address]   Blood Culture (routine x 2)     Status: None   Collection Time: 10/06/2020  1:57 PM   Specimen: BLOOD  Result Value Ref Range Status   Specimen Description BLOOD SITE NOT SPECIFIED  Final   Special Requests   Final    BOTTLES DRAWN AEROBIC AND ANAEROBIC Blood Culture results may not be optimal due to an inadequate volume of blood received in culture bottles   Culture   Final    NO GROWTH 5 DAYS Performed at [facility], [address]  [place], [place] [address]    Report Status 09/15/2020 FINAL  Final      Radiology Studies: No results found.     LOS: 8 days   Susa RaringPrashant Chaya Dehaan  [facility] Pager on [email or web address]  09/18/2020, 11:35 AM

## 2020-09-18 NOTE — Progress Notes (Signed)
Spoke with Dr. Leafy Half regarding the 2.37 second pause and patient current status, VS, and oxygen demand. Cardizem drip will be continued at 5mg /hr, RN will notify MD for pause that is greater than 3 seconds per MD order. Continue to monitor patient.

## 2020-09-18 NOTE — Plan of Care (Signed)
  Problem: Education: Goal: Knowledge of General Education information will improve Description: Including pain rating scale, medication(s)/side effects and non-pharmacologic comfort measures Outcome: Progressing   Problem: Health Behavior/Discharge Planning: Goal: Ability to manage health-related needs will improve Outcome: Progressing   Problem: Clinical Measurements: Goal: Ability to maintain clinical measurements within normal limits will improve Outcome: Progressing Goal: Will remain free from infection Outcome: Progressing Goal: Diagnostic test results will improve Outcome: Progressing Goal: Respiratory complications will improve Outcome: Progressing Goal: Cardiovascular complication will be avoided Outcome: Progressing   Problem: Activity: Goal: Risk for activity intolerance will decrease Outcome: Progressing   Problem: Nutrition: Goal: Adequate nutrition will be maintained Outcome: Progressing   Problem: Coping: Goal: Level of anxiety will decrease Outcome: Progressing   Problem: Elimination: Goal: Will not experience complications related to bowel motility Outcome: Progressing Goal: Will not experience complications related to urinary retention Outcome: Progressing   Problem: Pain Managment: Goal: General experience of comfort will improve Outcome: Progressing   Problem: Safety: Goal: Ability to remain free from injury will improve Outcome: Progressing   Problem: Skin Integrity: Goal: Risk for impaired skin integrity will decrease Outcome: Progressing   Problem: Education: Goal: Knowledge of risk factors and measures for prevention of condition will improve Outcome: Progressing   Problem: Coping: Goal: Psychosocial and spiritual needs will be supported Outcome: Progressing   Problem: Respiratory: Goal: Will maintain a patent airway Outcome: Progressing Goal: Complications related to the disease process, condition or treatment will be avoided or  minimized Outcome: Progressing   Problem: Education: Goal: Knowledge of disease or condition will improve Outcome: Progressing Goal: Understanding of medication regimen will improve Outcome: Progressing Goal: Individualized Educational Video(s) Outcome: Progressing   Problem: Activity: Goal: Ability to tolerate increased activity will improve Outcome: Progressing   Problem: Cardiac: Goal: Ability to achieve and maintain adequate cardiopulmonary perfusion will improve Outcome: Progressing   Problem: Health Behavior/Discharge Planning: Goal: Ability to safely manage health-related needs after discharge will improve Outcome: Progressing   

## 2020-09-19 DIAGNOSIS — J9601 Acute respiratory failure with hypoxia: Secondary | ICD-10-CM | POA: Diagnosis not present

## 2020-09-19 DIAGNOSIS — U071 COVID-19: Secondary | ICD-10-CM | POA: Diagnosis not present

## 2020-09-19 LAB — CBC WITH DIFFERENTIAL/PLATELET
Abs Immature Granulocytes: 0.22 10*3/uL — ABNORMAL HIGH (ref 0.00–0.07)
Basophils Absolute: 0 10*3/uL (ref 0.0–0.1)
Basophils Relative: 0 %
Eosinophils Absolute: 0 10*3/uL (ref 0.0–0.5)
Eosinophils Relative: 0 %
HCT: 38.2 % (ref 36.0–46.0)
Hemoglobin: 13.9 g/dL (ref 12.0–15.0)
Immature Granulocytes: 1 %
Lymphocytes Relative: 7 %
Lymphs Abs: 1 10*3/uL (ref 0.7–4.0)
MCH: 28.3 pg (ref 26.0–34.0)
MCHC: 36.4 g/dL — ABNORMAL HIGH (ref 30.0–36.0)
MCV: 77.8 fL — ABNORMAL LOW (ref 80.0–100.0)
Monocytes Absolute: 0.6 10*3/uL (ref 0.1–1.0)
Monocytes Relative: 4 %
Neutro Abs: 13.5 10*3/uL — ABNORMAL HIGH (ref 1.7–7.7)
Neutrophils Relative %: 88 %
Platelets: 346 10*3/uL (ref 150–400)
RBC: 4.91 MIL/uL (ref 3.87–5.11)
RDW: 12.3 % (ref 11.5–15.5)
WBC: 15.3 10*3/uL — ABNORMAL HIGH (ref 4.0–10.5)
nRBC: 0 % (ref 0.0–0.2)

## 2020-09-19 LAB — COMPREHENSIVE METABOLIC PANEL
ALT: 54 U/L — ABNORMAL HIGH (ref 0–44)
AST: 39 U/L (ref 15–41)
Albumin: 2.5 g/dL — ABNORMAL LOW (ref 3.5–5.0)
Alkaline Phosphatase: 64 U/L (ref 38–126)
Anion gap: 13 (ref 5–15)
BUN: 38 mg/dL — ABNORMAL HIGH (ref 8–23)
CO2: 21 mmol/L — ABNORMAL LOW (ref 22–32)
Calcium: 8.9 mg/dL (ref 8.9–10.3)
Chloride: 105 mmol/L (ref 98–111)
Creatinine, Ser: 1 mg/dL (ref 0.44–1.00)
GFR, Estimated: 56 mL/min — ABNORMAL LOW (ref >60)
Glucose, Bld: 142 mg/dL — ABNORMAL HIGH (ref 70–99)
Potassium: 4.3 mmol/L (ref 3.5–5.1)
Sodium: 139 mmol/L (ref 135–145)
Total Bilirubin: 0.8 mg/dL (ref 0.3–1.2)
Total Protein: 5.7 g/dL — ABNORMAL LOW (ref 6.5–8.1)

## 2020-09-19 LAB — GLUCOSE, CAPILLARY
Glucose-Capillary: 155 mg/dL — ABNORMAL HIGH (ref 70–99)
Glucose-Capillary: 237 mg/dL — ABNORMAL HIGH (ref 70–99)
Glucose-Capillary: 219 mg/dL — ABNORMAL HIGH (ref 70–99)
Glucose-Capillary: 147 mg/dL — ABNORMAL HIGH (ref 70–99)

## 2020-09-19 LAB — T3: T3, Total: 52 ng/dL — ABNORMAL LOW (ref 71–180)

## 2020-09-19 LAB — BRAIN NATRIURETIC PEPTIDE: B Natriuretic Peptide: 115.4 pg/mL — ABNORMAL HIGH (ref 0.0–100.0)

## 2020-09-19 LAB — D-DIMER, QUANTITATIVE: D-Dimer, Quant: 9.07 ug/mL-FEU — ABNORMAL HIGH (ref 0.00–0.50)

## 2020-09-19 LAB — C-REACTIVE PROTEIN: CRP: 0.5 mg/dL (ref <1.0)

## 2020-09-19 LAB — MAGNESIUM: Magnesium: 2.3 mg/dL (ref 1.7–2.4)

## 2020-09-19 MED ORDER — FUROSEMIDE 10 MG/ML IJ SOLN
40.0000 mg | Freq: Once | INTRAMUSCULAR | Status: AC
  Administered 2020-09-19: 40 mg via INTRAVENOUS
  Filled 2020-09-19: qty 4

## 2020-09-19 MED ORDER — DIGOXIN 0.25 MG/ML IJ SOLN
0.1250 mg | Freq: Four times a day (QID) | INTRAMUSCULAR | Status: AC
  Administered 2020-09-19 (×2): 0.125 mg via INTRAVENOUS
  Filled 2020-09-19 (×3): qty 0.5

## 2020-09-19 NOTE — Plan of Care (Signed)
  Problem: Clinical Measurements: Goal: Will remain free from infection Outcome: Progressing   Problem: Clinical Measurements: Goal: Respiratory complications will improve Outcome: Progressing   Problem: Clinical Measurements: Goal: Cardiovascular complication will be avoided Outcome: Progressing   Problem: Coping: Goal: Level of anxiety will decrease Outcome: Progressing   

## 2020-09-19 NOTE — Progress Notes (Signed)
PROGRESS NOTE  Hannah Torres [id number]:[id number] DOB: Jan 21, 1946 DOA: 2020-09-13  PCP: Pcp, No  Brief History/Interval Summary: 74 y.o. female with medical history significant of hypertension, former smoker presented to emergency department with worsening shortness of breath since 4 days.  She is not vaccinated against COVID-19.  Patient was noted to be hypoxic in the emergency department.  Chest x-ray showed bilateral opacities.  Patient was hospitalized for further management.  Reason for Visit: Pneumonia due to COVID-19.  Acute respiratory failure with hypoxia  Consultants: None  Procedures: None  Antibiotics: Anti-infectives (From admission, onward)   Start     Dose/Rate Route Frequency Ordered Stop   09/11/20 1000  remdesivir 100 mg in sodium chloride 0.9 % 100 mL IVPB       "Followed by" Linked Group Details   100 mg 200 mL/hr over 30 Minutes Intravenous Daily 09/13/2020 1815 09/14/20 1134   09-13-2020 2000  remdesivir 200 mg in sodium chloride 0.9% 250 mL IVPB       "Followed by" Linked Group Details   200 mg 580 mL/hr over 30 Minutes Intravenous Once Sep 13, 2020 1815 2020-09-13 2228   09/13/2020 1530  cefTRIAXone (ROCEPHIN) 2 g in sodium chloride 0.9 % 100 mL IVPB  Status:  Discontinued        2 g 200 mL/hr over 30 Minutes Intravenous Every 24 hours 13-Sep-2020 1517 09/11/20 0704   2020/09/13 1530  azithromycin (ZITHROMAX) 500 mg in sodium chloride 0.9 % 250 mL IVPB  Status:  Discontinued        500 mg 250 mL/hr over 60 Minutes Intravenous Every 24 hours 09-13-2020 1517 09/11/20 0704      Subjective/Interval History:   Patient in bed, appears comfortable, denies any headache, no fever, no chest pain or pressure, improving shortness of breath , no abdominal pain. No focal weakness.    Assessment/Plan:  Acute Hypoxic Resp. Failure/Pneumonia due to COVID-19 - She is unfortunately not vaccinated for Covid and has incurred severe parenchymal lung injury due to COVID-19 pneumonia, she has been  treated with IV steroids, Remdesivir and Baricitinib.  Continue to monitor closely still very tenuous.  Encouraged the patient to sit up in chair use I-S and flutter valve in the daytime for pulmonary toiletry.  SpO2: 100 % O2 Flow Rate (L/min): 12 L/min    Recent Labs  Lab 09/13/20 0411 09/13/20 0411 09/14/20 0046 09/14/20 0046 09/15/20 0351 09/16/20 0432 09/17/20 0300 09/18/20 0211 09/18/20 0901 09/19/20 0234  WBC 18.0*   < > 17.0*   < > 20.9* 24.1* 18.9* 16.1*  --  15.3*  CRP 5.8*  --  3.1*  --  1.4*  --   --   --   --  0.5  DDIMER >20.00*   < > >20.00*  --  >20.00*  --  14.06* 12.07*  --  9.07*  BNP  --   --   --   --   --  89.9  --   --  57.9 115.4*  AST 30  --  25  --  26  --   --   --   --  39  ALT 36  --  36  --  33  --   --   --   --  54*  ALKPHOS 70  --  75  --  74  --   --   --   --  64  BILITOT 0.6  --  0.8  --  0.5  --   --   --   --  0.8  ALBUMIN 2.2*  --  2.5*  --  2.6*  --   --   --   --  2.5*   < > = values in this interval not displayed.        Acute DVT bilateral lower extremities/presumed PE -  Patient found to have a bilateral lower extremity DVT.  Likely has acute PE as well though CT scan was not done due to her tenuous respiratory status.  Continue with therapeutic Lovenox for now.  Transition to oral agents when she is more stable.  D-dimer finally improving.  Atrial fibrillation with RVR with [place] vas 2 score of greater than 3. No previous history of same.  Stable echocardiogram and TSH, was on Cardizem infusion, will be transition to high-dose oral beta-blocker along with full dose Lovenox.  Will give 2 doses of IV digoxin on 09/19/2020 for better rate control, as needed IV Cardizem pushes also ordered.   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  Lab Results  Component Value Date   TSH 0.183 (L) 09/18/2020     Essential hypertension - B.Blocker.  Acute kidney injury -  Baseline kidney function is not known.  BUN and creatinine was noted to be elevated.  She was given IV fluids.  Renal function has resolved.  Continue to monitor.  Avoid nephrotoxic agents.  Slight increase in creatinine this morning is likely due to Lasix given the last few days.  Thrombocytosis/leukocytosis - Likely reactive due to acute infection.  Elevated WBC likely due to steroids.  Platelet counts have improved.  Transaminitis - Secondary to COVID-19.  Now resolved.  Hyperglycemia - No known history of diabetes.  HbA1c 6.4.  Hyperglycemia likely due to steroids.  Continue low-dose Lantus and SSI.  CBG (last 3)  Recent Labs    09/18/20 1605 09/18/20 2030 09/19/20 0735  GLUCAP 189* 203* 155*     DVT Prophylaxis: On therapeutic Lovenox Code Status: Full code Family Communication: Daughter Armando Reichert [phone]  on 09/19/2020 Disposition Plan: Hopefully return home when improved  Status is: Inpatient  Remains inpatient appropriate because:IV treatments appropriate due to intensity of illness or inability to take PO and Inpatient level of care appropriate due to severity of illness   Dispo:  Patient From: Home  Planned Disposition: Home  Expected discharge date: 09/20/20  Medically stable for discharge: No     Medications:  Scheduled:  vitamin C  500 mg Oral Daily   baricitinib  2 mg Oral Daily   digoxin  0.125 mg Intravenous Q6H   enoxaparin (LOVENOX) injection  1 mg/kg Subcutaneous Q12H   famotidine  20 mg Oral Daily   influenza vaccine adjuvanted  0.5 mL Intramuscular Tomorrow-1000   insulin aspart  0-20 Units Subcutaneous TID WC   insulin aspart  0-5 Units Subcutaneous QHS   insulin glargine  8 Units Subcutaneous QHS   methylPREDNISolone (SOLU-MEDROL) injection  60 mg Intravenous Q12H   metoprolol tartrate  50 mg Oral BID   mometasone-formoterol  2 puff Inhalation BID   zinc sulfate  220 mg Oral Daily   Continuous:  ZRA:[id number],  chlorpheniramine-HYDROcodone, diltiazem, guaiFENesin-dextromethorphan, lip balm, loperamide, ondansetron **OR** ondansetron (ZOFRAN) IV, phenol, sodium chloride   Objective:  Vital Signs  Vitals:   09/18/20 2100 09/18/20 2200 09/19/20 0100 09/19/20 0234  BP:  112/82 117/79   Pulse:      Resp:    19  Temp:  98.2 F (36.8 C)  TempSrc:    Oral  SpO2: 100% 100% 100% 100%  Weight:      Height:        Intake/Output Summary (Last 24 hours) at 09/19/2020 0930 Last data filed at 09/18/2020 1945 Gross per 24 hour  Intake --  Output 700 ml  Net -700 ml   Filed Weights   Sep 19, 2020 2150 September 19, 2020 2155 09/11/20 1649  Weight: 78.5 kg 79.4 kg 76.2 kg    Exam   Awake Alert, No new F.N deficits, Normal affect Lena.AT,PERRAL Supple Neck,No JVD, No cervical lymphadenopathy appriciated.  Symmetrical Chest wall movement, Good air movement bilaterally, CTAB RRR,No Gallops, Rubs or new Murmurs, No Parasternal Heave +ve B.Sounds, Abd Soft, No tenderness, No organomegaly appriciated, No rebound - guarding or rigidity. No Cyanosis, Clubbing or edema, No new Rash or bruise  Lab Results:  Data Reviewed: I have personally reviewed following labs and imaging studies  CBC: Recent Labs  Lab 09/13/20 0411 09/13/20 0411 09/14/20 0046 09/14/20 0046 09/15/20 0351 09/16/20 0432 09/17/20 0300 09/18/20 0211 09/19/20 0234  WBC 18.0*   < > 17.0*   < > 20.9* 24.1* 18.9* 16.1* 15.3*  NEUTROABS 15.0*  --  14.6*  --  18.4*  --   --   --  13.5*  HGB 13.7   < > 14.8   < > 15.1* 14.1 15.4* 14.7 13.9  HCT 37.4   < > 41.6   < > 41.3 40.1 43.8 40.8 38.2  MCV 77.9*   < > 78.8*   < > 77.9* 77.4* 80.2 78.3* 77.8*  PLT 483*   < > 512*   < > 512* 453* 356 373 346   < > = values in this interval not displayed.    Basic Metabolic Panel: Recent Labs  Lab 09/13/20 0411 09/13/20 0411 09/14/20 0046 09/14/20 0046 09/15/20 0351 09/16/20 0432 09/17/20 0300 09/18/20 0211 09/18/20 0901 09/19/20 0234  NA  146*   < > 145   < > 141 140 137 136  --  139  K 4.7   < > 5.0   < > 4.1 4.5 4.3 4.0  --  4.3  CL 115*   < > 114*   < > 109 110 104 103  --  105  CO2 18*   < > 17*   < > 19* 19* 22 21*  --  21*  GLUCOSE 200*   < > 193*   < > 210* 162* 166* 151*  --  142*  BUN 28*   < > 31*   < > 35* 37* 40* 40*  --  38*  CREATININE 0.94   < > 0.79   < > 0.81 0.81 1.01* 0.96  --  1.00  CALCIUM 9.5   < > 9.2   < > 9.4 9.3 9.2 9.1  --  8.9  MG 2.2  --  2.1  --  2.0  --   --   --  2.3 2.3   < > = values in this interval not displayed.    GFR: Estimated Creatinine Clearance: 51.2 mL/min (by C-G formula based on SCr of 1 mg/dL).  Liver Function Tests: Recent Labs  Lab 09/13/20 0411 09/14/20 0046 09/15/20 0351 09/19/20 0234  AST 30 25 26  39  ALT 36 36 33 54*  ALKPHOS 70 75 74 64  BILITOT 0.6 0.8 0.5 0.8  PROT 6.4* 6.3* 7.5 5.7*  ALBUMIN 2.2* 2.5* 2.6* 2.5*  Recent Results (from the past 240 hour(s))  Respiratory Panel by RT PCR (Flu A&B, Covid) - Nasopharyngeal Swab     Status: Abnormal   Collection Time: 09/16/2020  1:36 PM   Specimen: Nasopharyngeal Swab  Result Value Ref Range Status   SARS Coronavirus 2 by RT PCR POSITIVE (A) NEGATIVE Final    Comment: RESULT CALLED TO, READ BACK BY AND VERIFIED WITH: J,HAVILAND MD @1625  09/18/2020 EB (NOTE) SARS-CoV-2 target nucleic acids are DETECTED.  SARS-CoV-2 RNA is generally detectable in upper respiratory specimens  during the acute phase of infection. Positive results are indicative of the presence of the identified virus, but do not rule out bacterial infection or co-infection with other pathogens not detected by the test. Clinical correlation with patient history and other diagnostic information is necessary to determine patient infection status. The expected result is Negative.  Fact Sheet for Patients:  https://www.moore.com/https://www.fda.gov/media/142436/download  Fact Sheet for Healthcare Providers: https://www.young.biz/https://www.fda.gov/media/142435/download  This test is  not yet approved or cleared by the [place] FDA and  has been authorized for detection and/or diagnosis of SARS-CoV-2 by FDA under an Emergency Use Authorization (EUA).  This EUA will remain in effect (meaning this test can be used ) for the duration of  the COVID-19 declaration under Section 564(b)(1) of the Act, 21 U.S.C. section 360bbb-3(b)(1), unless the authorization is terminated or revoked sooner.      Influenza A by PCR NEGATIVE NEGATIVE Final   Influenza B by PCR NEGATIVE NEGATIVE Final    Comment: (NOTE) The Xpert Xpress SARS-CoV-2/FLU/RSV assay is intended as an aid in  the diagnosis of influenza from Nasopharyngeal swab specimens and  should not be used as a sole basis for treatment. Nasal washings and  aspirates are unacceptable for Xpert Xpress SARS-CoV-2/FLU/RSV  testing.  Fact Sheet for Patients: https://www.moore.com/https://www.fda.gov/media/142436/download  Fact Sheet for Healthcare Providers: https://www.young.biz/https://www.fda.gov/media/142435/download  This test is not yet approved or cleared by the [place] FDA and  has been authorized for detection and/or diagnosis of SARS-CoV-2 by  FDA under an Emergency Use Authorization (EUA). This EUA will remain  in effect (meaning this test can be used) for the duration of the  Covid-19 declaration under Section 564(b)(1) of the Act, 21  U.S.C. section 360bbb-3(b)(1), unless the authorization is  terminated or revoked. Performed at [facility], [address] [place], [place] [address]   Blood Culture (routine x 2)     Status: None   Collection Time: 09/29/2020  1:57 PM   Specimen: BLOOD  Result Value Ref Range Status   Specimen Description BLOOD SITE NOT SPECIFIED  Final   Special Requests   Final    BOTTLES DRAWN AEROBIC AND ANAEROBIC Blood Culture results may not be optimal due to an inadequate volume of blood received in culture bottles   Culture   Final    NO GROWTH 5 DAYS Performed at [facility], [address]  [place], [place] [address]    Report Status 09/15/2020 FINAL  Final      Radiology Studies: No results found.     LOS: 9 days   Susa RaringPrashant Shaterria Sager  [facility] Pager on [email or web address]  09/19/2020, 9:30 AM

## 2020-09-20 ENCOUNTER — Inpatient Hospital Stay (HOSPITAL_COMMUNITY): Payer: Medicare PPO

## 2020-09-20 DIAGNOSIS — U071 COVID-19: Secondary | ICD-10-CM | POA: Diagnosis not present

## 2020-09-20 DIAGNOSIS — J9601 Acute respiratory failure with hypoxia: Secondary | ICD-10-CM | POA: Diagnosis not present

## 2020-09-20 LAB — CBC WITH DIFFERENTIAL/PLATELET
Abs Immature Granulocytes: 0.12 10*3/uL — ABNORMAL HIGH (ref 0.00–0.07)
Basophils Absolute: 0 10*3/uL (ref 0.0–0.1)
Basophils Relative: 0 %
Eosinophils Absolute: 0 10*3/uL (ref 0.0–0.5)
Eosinophils Relative: 0 %
HCT: 40.3 % (ref 36.0–46.0)
Hemoglobin: 14.6 g/dL (ref 12.0–15.0)
Immature Granulocytes: 1 %
Lymphocytes Relative: 6 %
Lymphs Abs: 0.9 10*3/uL (ref 0.7–4.0)
MCH: 28.2 pg (ref 26.0–34.0)
MCHC: 36.2 g/dL — ABNORMAL HIGH (ref 30.0–36.0)
MCV: 77.9 fL — ABNORMAL LOW (ref 80.0–100.0)
Monocytes Absolute: 0.6 10*3/uL (ref 0.1–1.0)
Monocytes Relative: 4 %
Neutro Abs: 13.6 10*3/uL — ABNORMAL HIGH (ref 1.7–7.7)
Neutrophils Relative %: 89 %
Platelets: 324 10*3/uL (ref 150–400)
RBC: 5.17 MIL/uL — ABNORMAL HIGH (ref 3.87–5.11)
RDW: 12.3 % (ref 11.5–15.5)
WBC: 15.2 10*3/uL — ABNORMAL HIGH (ref 4.0–10.5)
nRBC: 0 % (ref 0.0–0.2)

## 2020-09-20 LAB — GLUCOSE, CAPILLARY
Glucose-Capillary: 135 mg/dL — ABNORMAL HIGH (ref 70–99)
Glucose-Capillary: 242 mg/dL — ABNORMAL HIGH (ref 70–99)
Glucose-Capillary: 104 mg/dL — ABNORMAL HIGH (ref 70–99)
Glucose-Capillary: 95 mg/dL (ref 70–99)

## 2020-09-20 LAB — COMPREHENSIVE METABOLIC PANEL
ALT: 104 U/L — ABNORMAL HIGH (ref 0–44)
AST: 72 U/L — ABNORMAL HIGH (ref 15–41)
Albumin: 2.7 g/dL — ABNORMAL LOW (ref 3.5–5.0)
Alkaline Phosphatase: 69 U/L (ref 38–126)
Anion gap: 12 (ref 5–15)
BUN: 41 mg/dL — ABNORMAL HIGH (ref 8–23)
CO2: 21 mmol/L — ABNORMAL LOW (ref 22–32)
Calcium: 9.1 mg/dL (ref 8.9–10.3)
Chloride: 102 mmol/L (ref 98–111)
Creatinine, Ser: 0.87 mg/dL (ref 0.44–1.00)
GFR, Estimated: >60 mL/min (ref >60)
Glucose, Bld: 131 mg/dL — ABNORMAL HIGH (ref 70–99)
Potassium: 5.5 mmol/L — ABNORMAL HIGH (ref 3.5–5.1)
Sodium: 135 mmol/L (ref 135–145)
Total Bilirubin: 1.8 mg/dL — ABNORMAL HIGH (ref 0.3–1.2)
Total Protein: 6 g/dL — ABNORMAL LOW (ref 6.5–8.1)

## 2020-09-20 LAB — D-DIMER, QUANTITATIVE: D-Dimer, Quant: 3.82 ug/mL-FEU — ABNORMAL HIGH (ref 0.00–0.50)

## 2020-09-20 LAB — MAGNESIUM: Magnesium: 2.5 mg/dL — ABNORMAL HIGH (ref 1.7–2.4)

## 2020-09-20 LAB — BRAIN NATRIURETIC PEPTIDE: B Natriuretic Peptide: 37.4 pg/mL (ref 0.0–100.0)

## 2020-09-20 LAB — C-REACTIVE PROTEIN: CRP: <0.5 mg/dL (ref <1.0)

## 2020-09-20 MED ORDER — SODIUM ZIRCONIUM CYCLOSILICATE 10 G PO PACK
10.0000 g | PACK | Freq: Two times a day (BID) | ORAL | Status: AC
  Administered 2020-09-20 (×2): 10 g via ORAL
  Filled 2020-09-20 (×2): qty 1

## 2020-09-20 MED ORDER — METHYLPREDNISOLONE SODIUM SUCC 40 MG IJ SOLR
40.0000 mg | Freq: Two times a day (BID) | INTRAMUSCULAR | Status: DC
  Administered 2020-09-20 – 2020-09-28 (×17): 40 mg via INTRAVENOUS
  Filled 2020-09-20 (×15): qty 1

## 2020-09-20 MED ORDER — FUROSEMIDE 10 MG/ML IJ SOLN
60.0000 mg | Freq: Once | INTRAMUSCULAR | Status: AC
  Administered 2020-09-20: 60 mg via INTRAVENOUS
  Filled 2020-09-20: qty 6

## 2020-09-20 MED ORDER — SODIUM POLYSTYRENE SULFONATE 15 GM/60ML PO SUSP
30.0000 g | Freq: Once | ORAL | Status: AC
  Administered 2020-09-20: 30 g via ORAL
  Filled 2020-09-20 (×2): qty 120

## 2020-09-20 NOTE — Progress Notes (Signed)
PROGRESS NOTE  Hannah Torres WUJ:[id number] DOB: 1946-05-21 DOA: 09/26/2020  PCP: Pcp, No  Brief History/Interval Summary: 74 y.o. female with medical history significant of hypertension, former smoker presented to emergency department with worsening shortness of breath since 4 days.  She is not vaccinated against COVID-19.  Patient was noted to be hypoxic in the emergency department.  Chest x-ray showed bilateral opacities.  Patient was hospitalized for further management.  Reason for Visit: Pneumonia due to COVID-19.  Acute respiratory failure with hypoxia  Consultants: None  Procedures: None  Antibiotics: Anti-infectives (From admission, onward)   Start     Dose/Rate Route Frequency Ordered Stop   09/11/20 1000  remdesivir 100 mg in sodium chloride 0.9 % 100 mL IVPB       "Followed by" Linked Group Details   100 mg 200 mL/hr over 30 Minutes Intravenous Daily Oct 05, 2020 1815 09/14/20 1134   05-Oct-2020 2000  remdesivir 200 mg in sodium chloride 0.9% 250 mL IVPB       "Followed by" Linked Group Details   200 mg 580 mL/hr over 30 Minutes Intravenous Once 10/04/2020 1815 05-Oct-2020 2228   09/13/2020 1530  cefTRIAXone (ROCEPHIN) 2 g in sodium chloride 0.9 % 100 mL IVPB  Status:  Discontinued        2 g 200 mL/hr over 30 Minutes Intravenous Every 24 hours 09/15/2020 1517 09/11/20 0704   09/16/2020 1530  azithromycin (ZITHROMAX) 500 mg in sodium chloride 0.9 % 250 mL IVPB  Status:  Discontinued        500 mg 250 mL/hr over 60 Minutes Intravenous Every 24 hours 09/19/2020 1517 09/11/20 0704      Subjective/Interval History:   Patient in bed, appears comfortable, denies any headache, no fever, no chest pain or pressure, he is still quite shortness of breath , no abdominal pain. No focal weakness.    Assessment/Plan:  Acute Hypoxic Resp. Failure/Pneumonia due to COVID-19 - She is unfortunately not vaccinated for Covid and has incurred severe parenchymal lung injury due to COVID-19 pneumonia, she  has been treated with IV steroids, Remdesivir and Baricitinib.  IV Lasix as needed being used as well, overall still quite tenuous, still on 15 L of nasal cannula oxygen along with nonrebreather mask, if any worse will place her on heated high flow, encouraged her to sit up in chair in daytime use I-S and flutter valve for pulmonary toiletry and prone at night if possible.Marland Kitchen  SpO2: 97 % O2 Flow Rate (L/min): 12 L/min    Recent Labs  Lab 09/14/20 0046 09/14/20 0046 09/15/20 0351 09/15/20 0351 09/16/20 0432 09/17/20 0300 09/18/20 0211 09/18/20 0901 09/19/20 0234 09/20/20 0442  WBC 17.0*   < > 20.9*   < > 24.1* 18.9* 16.1*  --  15.3* 15.2*  CRP 3.1*  --  1.4*  --   --   --   --   --  0.5 <0.5  DDIMER >20.00*   < > >20.00*  --   --  14.06* 12.07*  --  9.07* 3.82*  BNP  --   --   --   --  89.9  --   --  57.9 115.4* 37.4  AST 25  --  26  --   --   --   --   --  39 72*  ALT 36  --  33  --   --   --   --   --  54* 104*  ALKPHOS 75  --  74  --   --   --   --   --  64 69  BILITOT 0.8  --  0.5  --   --   --   --   --  0.8 1.8*  ALBUMIN 2.5*  --  2.6*  --   --   --   --   --  2.5* 2.7*   < > = values in this interval not displayed.        Acute DVT bilateral lower extremities/presumed PE -  Patient found to have a bilateral lower extremity DVT.  Likely has acute PE as well though CT scan was not done due to her tenuous respiratory status.  Continue with therapeutic Lovenox for now.  Transition to oral agents when she is more stable.  D-dimer finally improving.  Atrial fibrillation with RVR with [place] vas 2 score of greater than 3. No previous history of same.  Stable echocardiogram and TSH, was on Cardizem infusion, and 2 doses of IV digoxin, currently appears stable on high-dose oral beta-blocker which will be continued.  Already on full dose anticoagulation due to bilateral lower DVTs and presumed PE.   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  Lab Results  Component Value Date   TSH 0.183 (L) 09/18/2020     Essential hypertension - B.Blocker.  Acute kidney injury - Baseline kidney function is not known.  BUN and creatinine was noted to be elevated.  She was given IV fluids.  Renal function has resolved.  Continue to monitor.  Avoid nephrotoxic agents.  Slight increase in creatinine this morning is likely due to Lasix given the last few days.  Hyperkalemia.  IV Lasix on 09/20/2020, Lokelma and Kayexalate.  Monitor.    Thrombocytosis/leukocytosis - Likely reactive due to acute infection.  Elevated WBC likely due to steroids.  Platelet counts have improved.  Transaminitis - Secondary to COVID-19.  Slightly uptrend on 09/20/20 - check RUQ [place] - asymptomatic.  Hyperglycemia - No known history of diabetes.  HbA1c 6.4.  Hyperglycemia likely due to steroids.  Continue low-dose Lantus and SSI.  CBG (last 3)  Recent Labs    09/19/20 1647 09/19/20 2058 09/20/20 0812  GLUCAP 219* 147* 135*      DVT Prophylaxis: On therapeutic Lovenox Code Status: Full code Family Communication: Daughter Armando Reichert [phone]  on 09/19/2020 Disposition Plan: Hopefully return home when improved  Status is: Inpatient  Remains inpatient appropriate because:IV treatments appropriate due to intensity of illness or inability to take PO and Inpatient level of care appropriate due to severity of illness   Dispo:  Patient From: Home  Planned Disposition: Home  Expected discharge date: 09/20/20  Medically stable for discharge: No     Medications:  Scheduled:  vitamin C  500 mg Oral Daily   baricitinib  2 mg Oral Daily   enoxaparin (LOVENOX) injection  1 mg/kg Subcutaneous Q12H   famotidine  20 mg Oral Daily   influenza vaccine adjuvanted  0.5 mL Intramuscular Tomorrow-1000   insulin aspart  0-20 Units Subcutaneous TID WC   insulin aspart  0-5 Units Subcutaneous QHS   insulin glargine  8 Units  Subcutaneous QHS   methylPREDNISolone (SOLU-MEDROL) injection  40 mg Intravenous Q12H   metoprolol tartrate  50 mg Oral BID   mometasone-formoterol  2 puff Inhalation BID   sodium polystyrene  30 g Oral Once   sodium zirconium cyclosilicate  10 g Oral BID   zinc sulfate  220 mg Oral Daily  Continuous:  XBJ:[id number], chlorpheniramine-HYDROcodone, diltiazem, guaiFENesin-dextromethorphan, lip balm, loperamide, [DISCONTINUED] ondansetron **OR** ondansetron (ZOFRAN) IV, phenol, sodium chloride   Objective:  Vital Signs  Vitals:   09/19/20 2000 09/19/20 2123 09/19/20 2147 09/20/20 0814  BP:   133/72 (!) 142/78  Pulse: 87  85 80  Resp:  (!) 21  18  Temp: 97.6 F (36.4 C)   97.8 F (36.6 C)  TempSrc: Oral   Oral  SpO2:      Weight:      Height:        Intake/Output Summary (Last 24 hours) at 09/20/2020 0927 Last data filed at 09/19/2020 1827 Gross per 24 hour  Intake --  Output 750 ml  Net -750 ml   Filed Weights   10/04/2020 2150 09/19/2020 2155 09/11/20 1649  Weight: 78.5 kg 79.4 kg 76.2 kg    Exam   Awake Alert, No new F.N deficits, Normal affect [place].AT,PERRAL Supple Neck,No JVD, No cervical lymphadenopathy appriciated.  Symmetrical Chest wall movement, Good air movement bilaterally, few rales RRR,No Gallops, Rubs or new Murmurs, No Parasternal Heave +ve B.Sounds, Abd Soft, No tenderness, No organomegaly appriciated, No rebound - guarding or rigidity. No Cyanosis, Clubbing or edema, No new Rash or bruise   Lab Results:  Data Reviewed: I have personally reviewed following labs and imaging studies  CBC: Recent Labs  Lab 09/14/20 0046 09/14/20 0046 09/15/20 0351 09/15/20 0351 09/16/20 0432 09/17/20 0300 09/18/20 0211 09/19/20 0234 09/20/20 0442  WBC 17.0*   < > 20.9*   < > 24.1* 18.9* 16.1* 15.3* 15.2*  NEUTROABS 14.6*  --  18.4*  --   --   --   --  13.5* 13.6*  HGB 14.8   < > 15.1*   < > 14.1 15.4* 14.7 13.9 14.6  HCT 41.6   < > 41.3   < > 40.1  43.8 40.8 38.2 40.3  MCV 78.8*   < > 77.9*   < > 77.4* 80.2 78.3* 77.8* 77.9*  PLT 512*   < > 512*   < > 453* 356 373 346 324   < > = values in this interval not displayed.    Basic Metabolic Panel: Recent Labs  Lab 09/14/20 0046 09/14/20 0046 09/15/20 0351 09/15/20 0351 09/16/20 0432 09/17/20 0300 09/18/20 0211 09/18/20 0901 09/19/20 0234 09/20/20 0442  NA 145   < > 141   < > 140 137 136  --  139 135  K 5.0   < > 4.1   < > 4.5 4.3 4.0  --  4.3 5.5*  CL 114*   < > 109   < > 110 104 103  --  105 102  CO2 17*   < > 19*   < > 19* 22 21*  --  21* 21*  GLUCOSE 193*   < > 210*   < > 162* 166* 151*  --  142* 131*  BUN 31*   < > 35*   < > 37* 40* 40*  --  38* 41*  CREATININE 0.79   < > 0.81   < > 0.81 1.01* 0.96  --  1.00 0.87  CALCIUM 9.2   < > 9.4   < > 9.3 9.2 9.1  --  8.9 9.1  MG 2.1  --  2.0  --   --   --   --  2.3 2.3 2.5*   < > = values in this interval not displayed.    GFR: Estimated Creatinine Clearance: 58.8  mL/min (by C-G formula based on SCr of 0.87 mg/dL).  Liver Function Tests: Recent Labs  Lab 09/14/20 0046 09/15/20 0351 09/19/20 0234 09/20/20 0442  AST 25 26 39 72*  ALT 36 33 54* 104*  ALKPHOS 75 74 64 69  BILITOT 0.8 0.5 0.8 1.8*  PROT 6.3* 7.5 5.7* 6.0*  ALBUMIN 2.5* 2.6* 2.5* 2.7*     Recent Results (from the past 240 hour(s))  Respiratory Panel by RT PCR (Flu A&B, Covid) - Nasopharyngeal Swab     Status: Abnormal   Collection Time: 09-18-20  1:36 PM   Specimen: Nasopharyngeal Swab  Result Value Ref Range Status   SARS Coronavirus 2 by RT PCR POSITIVE (A) NEGATIVE Final    Comment: RESULT CALLED TO, READ BACK BY AND VERIFIED WITH: J,HAVILAND MD @1625  Sep 18, 2020 EB (NOTE) SARS-CoV-2 target nucleic acids are DETECTED.  SARS-CoV-2 RNA is generally detectable in upper respiratory specimens  during the acute phase of infection. Positive results are indicative of the presence of the identified virus, but do not rule out bacterial infection or  co-infection with other pathogens not detected by the test. Clinical correlation with patient history and other diagnostic information is necessary to determine patient infection status. The expected result is Negative.  Fact Sheet for Patients:  11/10/20  Fact Sheet for Healthcare Providers: https://www.moore.com/  This test is not yet approved or cleared by the https://www.young.biz/ FDA and  has been authorized for detection and/or diagnosis of SARS-CoV-2 by FDA under an Emergency Use Authorization (EUA).  This EUA will remain in effect (meaning this test can be used ) for the duration of  the COVID-19 declaration under Section 564(b)(1) of the Act, 21 U.S.C. section 360bbb-3(b)(1), unless the authorization is terminated or revoked sooner.      Influenza A by PCR NEGATIVE NEGATIVE Final   Influenza B by PCR NEGATIVE NEGATIVE Final    Comment: (NOTE) The Xpert Xpress SARS-CoV-2/FLU/RSV assay is intended as an aid in  the diagnosis of influenza from Nasopharyngeal swab specimens and  should not be used as a sole basis for treatment. Nasal washings and  aspirates are unacceptable for Xpert Xpress SARS-CoV-2/FLU/RSV  testing.  Fact Sheet for Patients: [place]  Fact Sheet for Healthcare Providers: https://www.moore.com/  This test is not yet approved or cleared by the https://www.young.biz/ FDA and  has been authorized for detection and/or diagnosis of SARS-CoV-2 by  FDA under an Emergency Use Authorization (EUA). This EUA will remain  in effect (meaning this test can be used) for the duration of the  Covid-19 declaration under Section 564(b)(1) of the Act, 21  U.S.C. section 360bbb-3(b)(1), unless the authorization is  terminated or revoked. Performed at [facility], [address] [place], [place]   Blood Culture (routine x 2)     Status: None   Collection Time:  09-18-2020  1:57 PM   Specimen: BLOOD  Result Value Ref Range Status   Specimen Description BLOOD SITE NOT SPECIFIED  Final   Special Requests   Final    BOTTLES DRAWN AEROBIC AND ANAEROBIC Blood Culture results may not be optimal due to an inadequate volume of blood received in culture bottles   Culture   Final    NO GROWTH 5 DAYS Performed at [facility], [address] [place], [place]    Report Status 09/15/2020 FINAL  Final      Radiology Studies: DG Chest Port 1 View  Result Date: 09/20/2020 CLINICAL DATA:  Shortness of breath.  COVID-19 positive EXAM: PORTABLE CHEST 1 VIEW COMPARISON:  September 16, 2020 FINDINGS: There is airspace opacity in each lower lung region which overall is somewhat increased bilaterally. Heart size is upper normal with pulmonary vascularity normal. No adenopathy. No bone lesions. IMPRESSION: Increased airspace opacity in each lung base consistent with multifocal pneumonia. Atypical organism pneumonia suspected in this circumstance, particularly given the history. Stable cardiac silhouette. No adenopathy evident. Electronically Signed   By: Bretta BangWilliam  Woodruff III M.D.   On: 09/20/2020 09:18       LOS: 10 days   Hannah Torres Thedore MinsSingh  [facility] Pager on [email or web address]  09/20/2020, 9:27 AM

## 2020-09-20 NOTE — Plan of Care (Signed)
  Problem: Clinical Measurements: Goal: Will remain free from infection Outcome: Progressing   Problem: Clinical Measurements: Goal: Respiratory complications will improve Outcome: Progressing   Problem: Clinical Measurements: Goal: Cardiovascular complication will be avoided Outcome: Progressing   Problem: Nutrition: Goal: Adequate nutrition will be maintained Outcome: Progressing   Problem: Coping: Goal: Level of anxiety will decrease Outcome: Progressing   Problem: Pain Managment: Goal: General experience of comfort will improve Outcome: Progressing   Problem: Safety: Goal: Ability to remain free from injury will improve Outcome: Progressing   Problem: Skin Integrity: Goal: Risk for impaired skin integrity will decrease Outcome: Progressing   Problem: Respiratory: Goal: Will maintain a patent airway Outcome: Progressing   Problem: Respiratory: Goal: Complications related to the disease process, condition or treatment will be avoided or minimized Outcome: Progressing

## 2020-09-21 DIAGNOSIS — U071 COVID-19: Secondary | ICD-10-CM | POA: Diagnosis not present

## 2020-09-21 DIAGNOSIS — J9601 Acute respiratory failure with hypoxia: Secondary | ICD-10-CM | POA: Diagnosis not present

## 2020-09-21 LAB — D-DIMER, QUANTITATIVE: D-Dimer, Quant: 7.04 ug/mL-FEU — ABNORMAL HIGH (ref 0.00–0.50)

## 2020-09-21 LAB — CBC WITH DIFFERENTIAL/PLATELET
Abs Immature Granulocytes: 0.12 10*3/uL — ABNORMAL HIGH (ref 0.00–0.07)
Basophils Absolute: 0 10*3/uL (ref 0.0–0.1)
Basophils Relative: 0 %
Eosinophils Absolute: 0 10*3/uL (ref 0.0–0.5)
Eosinophils Relative: 0 %
HCT: 38.5 % (ref 36.0–46.0)
Hemoglobin: 14 g/dL (ref 12.0–15.0)
Immature Granulocytes: 1 %
Lymphocytes Relative: 7 %
Lymphs Abs: 1 10*3/uL (ref 0.7–4.0)
MCH: 28.2 pg (ref 26.0–34.0)
MCHC: 36.4 g/dL — ABNORMAL HIGH (ref 30.0–36.0)
MCV: 77.5 fL — ABNORMAL LOW (ref 80.0–100.0)
Monocytes Absolute: 0.7 10*3/uL (ref 0.1–1.0)
Monocytes Relative: 5 %
Neutro Abs: 12.1 10*3/uL — ABNORMAL HIGH (ref 1.7–7.7)
Neutrophils Relative %: 87 %
Platelets: 274 10*3/uL (ref 150–400)
RBC: 4.97 MIL/uL (ref 3.87–5.11)
RDW: 12.2 % (ref 11.5–15.5)
WBC: 13.9 10*3/uL — ABNORMAL HIGH (ref 4.0–10.5)
nRBC: 0 % (ref 0.0–0.2)

## 2020-09-21 LAB — COMPREHENSIVE METABOLIC PANEL
ALT: 115 U/L — ABNORMAL HIGH (ref 0–44)
AST: 49 U/L — ABNORMAL HIGH (ref 15–41)
Albumin: 2.8 g/dL — ABNORMAL LOW (ref 3.5–5.0)
Alkaline Phosphatase: 72 U/L (ref 38–126)
Anion gap: 13 (ref 5–15)
BUN: 29 mg/dL — ABNORMAL HIGH (ref 8–23)
CO2: 27 mmol/L (ref 22–32)
Calcium: 8.8 mg/dL — ABNORMAL LOW (ref 8.9–10.3)
Chloride: 94 mmol/L — ABNORMAL LOW (ref 98–111)
Creatinine, Ser: 0.99 mg/dL (ref 0.44–1.00)
GFR, Estimated: 57 mL/min — ABNORMAL LOW (ref >60)
Glucose, Bld: 108 mg/dL — ABNORMAL HIGH (ref 70–99)
Potassium: 3.4 mmol/L — ABNORMAL LOW (ref 3.5–5.1)
Sodium: 134 mmol/L — ABNORMAL LOW (ref 135–145)
Total Bilirubin: 0.9 mg/dL (ref 0.3–1.2)
Total Protein: 6.2 g/dL — ABNORMAL LOW (ref 6.5–8.1)

## 2020-09-21 LAB — BRAIN NATRIURETIC PEPTIDE: B Natriuretic Peptide: 74.2 pg/mL (ref 0.0–100.0)

## 2020-09-21 LAB — MAGNESIUM: Magnesium: 2.2 mg/dL (ref 1.7–2.4)

## 2020-09-21 LAB — C-REACTIVE PROTEIN: CRP: <0.5 mg/dL (ref <1.0)

## 2020-09-21 LAB — GLUCOSE, CAPILLARY
Glucose-Capillary: 124 mg/dL — ABNORMAL HIGH (ref 70–99)
Glucose-Capillary: 114 mg/dL — ABNORMAL HIGH (ref 70–99)
Glucose-Capillary: 113 mg/dL — ABNORMAL HIGH (ref 70–99)
Glucose-Capillary: 186 mg/dL — ABNORMAL HIGH (ref 70–99)

## 2020-09-21 MED ORDER — POTASSIUM CHLORIDE CRYS ER 20 MEQ PO TBCR
40.0000 meq | EXTENDED_RELEASE_TABLET | Freq: Once | ORAL | Status: AC
  Administered 2020-09-21: 40 meq via ORAL
  Filled 2020-09-21: qty 2

## 2020-09-21 NOTE — Plan of Care (Signed)
  Problem: Clinical Measurements: Goal: Will remain free from infection Outcome: Progressing   Problem: Clinical Measurements: Goal: Respiratory complications will improve Outcome: Progressing   Problem: Clinical Measurements: Goal: Cardiovascular complication will be avoided Outcome: Progressing   Problem: Nutrition: Goal: Adequate nutrition will be maintained Outcome: Progressing   Problem: Coping: Goal: Level of anxiety will decrease Outcome: Progressing   Problem: Elimination: Goal: Will not experience complications related to urinary retention Outcome: Progressing   Problem: Safety: Goal: Ability to remain free from injury will improve Outcome: Progressing   Problem: Skin Integrity: Goal: Risk for impaired skin integrity will decrease Outcome: Progressing   Problem: Respiratory: Goal: Will maintain a patent airway Outcome: Progressing

## 2020-09-21 NOTE — Plan of Care (Signed)
Patient remains free from falls. Weaning 02 slowly,Oxygen  Sats drop quickly,Encouraged use of accupella.

## 2020-09-21 NOTE — Progress Notes (Addendum)
PROGRESS NOTE  Hannah Torres RSW:[id number] DOB: [birth year] DOA: 10/04/2020  PCP: Pcp, No  Brief History/Interval Summary: 74 y.o. female with medical history significant of hypertension, former smoker presented to emergency department with worsening shortness of breath since 4 days.  She is not vaccinated against COVID-19.  Patient was noted to be hypoxic in the emergency department.  Chest x-ray showed bilateral opacities.  Patient was hospitalized for further management.  Reason for Visit: Pneumonia due to COVID-19.  Acute respiratory failure with hypoxia  Consultants: None  Procedures: None  Antibiotics: Anti-infectives (From admission, onward)   Start     Dose/Rate Route Frequency Ordered Stop   09/11/20 1000  remdesivir 100 mg in sodium chloride 0.9 % 100 mL IVPB       "Followed by" Linked Group Details   100 mg 200 mL/hr over 30 Minutes Intravenous Daily 09/17/2020 1815 09/14/20 1134   09/28/2020 2000  remdesivir 200 mg in sodium chloride 0.9% 250 mL IVPB       "Followed by" Linked Group Details   200 mg 580 mL/hr over 30 Minutes Intravenous Once 09/15/2020 1815 09/20/2020 2228   10/04/2020 1530  cefTRIAXone (ROCEPHIN) 2 g in sodium chloride 0.9 % 100 mL IVPB  Status:  Discontinued        2 g 200 mL/hr over 30 Minutes Intravenous Every 24 hours 09/17/2020 1517 09/11/20 0704   09/27/2020 1530  azithromycin (ZITHROMAX) 500 mg in sodium chloride 0.9 % 250 mL IVPB  Status:  Discontinued        500 mg 250 mL/hr over 60 Minutes Intravenous Every 24 hours 10/04/2020 1517 09/11/20 0704      Subjective/Interval History:   Patient in bed, appears comfortable, denies any headache, no fever, no chest pain or pressure, ++ shortness of breath , no abdominal pain. No focal weakness.   Assessment/Plan:  Acute Hypoxic Resp. Failure/Pneumonia due to COVID-19 + DVT and possible PE - She is unfortunately not vaccinated for Covid and has incurred severe parenchymal lung injury due to COVID-19 pneumonia,  she has been treated with IV steroids, Remdesivir and Baricitinib.  IV Lasix as needed being used as well, overall still quite tenuous, still on 15 L of nasal cannula oxygen along with nonrebreather mask >> we will transition him to heated high flow for comfort and single source monitored oxygen supply (she often pulls the mask and drops her Pox into 80s), encouraged her to sit up in chair in daytime use I-S and flutter valve for pulmonary toiletry and prone at night if possible.  HHFL - order placed 09/20/20 - DW RT Mendy again on 09/21/20 - explained clearly the rationale of using HHFL and my concerns of poor outcome without it.    SpO2: 94 % O2 Flow Rate (L/min): 15 L/min    Recent Labs  Lab 09/15/20 0351 09/15/20 0351 09/16/20 0432 09/16/20 0432 09/17/20 0300 09/18/20 0211 09/18/20 0901 09/19/20 0234 09/20/20 0442 09/21/20 0409  WBC 20.9*   < > 24.1*   < > 18.9* 16.1*  --  15.3* 15.2* 13.9*  CRP 1.4*  --   --   --   --   --   --  0.5 <0.5 <0.5  DDIMER >20.00*   < >  --   --  14.06* 12.07*  --  9.07* 3.82* 7.04*  BNP  --   --  89.9  --   --   --  57.9 115.4* 37.4 74.2  AST 26  --   --   --   --   --   --  39 72* 49*  ALT 33  --   --   --   --   --   --  54* 104* 115*  ALKPHOS 74  --   --   --   --   --   --  64 69 72  BILITOT 0.5  --   --   --   --   --   --  0.8 1.8* 0.9  ALBUMIN 2.6*  --   --   --   --   --   --  2.5* 2.7* 2.8*   < > = values in this interval not displayed.        Acute DVT bilateral lower extremities/presumed PE -  Patient found to have a bilateral lower extremity DVT.  Likely has acute PE as well though CT scan was not done due to her tenuous respiratory status.  Continue with therapeutic Lovenox for now.  Transition to oral agents when she is more stable.  D-dimer finally improving.  Atrial fibrillation with RVR with [place] vas 2 score of greater than 3. No previous history of same.  Stable echocardiogram and TSH, was on Cardizem infusion, and 2 doses of IV  digoxin, currently appears stable on high-dose oral beta-blocker which will be continued.  Already on full dose anticoagulation due to bilateral lower DVTs and presumed PE.   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  Lab Results  Component Value Date   TSH 0.183 (L) 09/18/2020     Essential hypertension - B.Blocker.  Acute kidney injury - caused by ATN from infection, resolved after hydration.  Hyperkalemia.  IV Lasix on 09/20/2020, Lokelma and Kayexalate.  Resolved.    Thrombocytosis/leukocytosis - Likely reactive due to acute infection.  Elevated WBC likely due to steroids.  Platelet counts have improved.  Transaminitis - Secondary to COVID-19 and possibly remdesivir use.  She is asymptomatic and has stable RUQ US - monitor trend closely.  Hyperglycemia - No known history of diabetes.  HbA1c 6.4.  Hyperglycemia likely due to steroids.  Continue low-dose Lantus and SSI.  CBG (last 3)  Recent Labs    09/20/20 1210 09/20/20 1714 09/20/20 2124  GLUCAP 242* 104* 95      DVT Prophylaxis: On therapeutic Lovenox Code Status: Full code Family Communication: Daughter Armando ReichertMarian [phone]  on 09/19/2020 Disposition Plan: Hopefully return home when improved  Status is: Inpatient  Remains inpatient appropriate because:IV treatments appropriate due to intensity of illness or inability to take PO and Inpatient level of care appropriate due to severity of illness   Dispo:  Patient From: Home  Planned Disposition: Home  Expected discharge date: 09/20/20  Medically stable for discharge: No     Medications:  Scheduled: . vitamin C  500 mg Oral Daily  . baricitinib  2 mg Oral Daily  . enoxaparin (LOVENOX) injection  1 mg/kg Subcutaneous Q12H  . famotidine  20 mg Oral Daily  . influenza vaccine adjuvanted  0.5 mL Intramuscular Tomorrow-1000  . insulin aspart  0-20 Units Subcutaneous TID WC  . insulin  aspart  0-5 Units Subcutaneous QHS  . insulin glargine  8 Units Subcutaneous QHS  . methylPREDNISolone (SOLU-MEDROL) injection  40 mg Intravenous Q12H  . metoprolol tartrate  50 mg Oral BID  . mometasone-formoterol  2 puff Inhalation BID  . potassium chloride  40 mEq Oral Once  . zinc sulfate  220 mg Oral  Daily   Continuous:  UKG:[id number], chlorpheniramine-HYDROcodone, diltiazem, guaiFENesin-dextromethorphan, lip balm, loperamide, [DISCONTINUED] ondansetron **OR** ondansetron (ZOFRAN) IV, phenol, sodium chloride   Objective:  Vital Signs  Vitals:   09/20/20 2149 09/21/20 0120 09/21/20 0357 09/21/20 0601  BP: 140/77 126/68 (!) 110/54   Pulse: 81 64    Resp:  (!) 24 19   Temp:  97.7 F (36.5 C) (!) 97.4 F (36.3 C) (!) 97.3 F (36.3 C)  TempSrc:  Axillary Oral Oral  SpO2:  95% 94%   Weight:      Height:        Intake/Output Summary (Last 24 hours) at 09/21/2020 0914 Last data filed at 09/21/2020 0133 Gross per 24 hour  Intake --  Output 1800 ml  Net -1800 ml   Filed Weights   2020-09-19 2150 09-19-20 2155 09/11/20 1649  Weight: 78.5 kg 79.4 kg 76.2 kg    Exam   Awake Alert, No new F.N deficits, Normal affect [place].AT,PERRAL Supple Neck,No JVD, No cervical lymphadenopathy appriciated.  Symmetrical Chest wall movement, Good air movement bilaterally, +ve rales RRR,No Gallops, Rubs or new Murmurs, No Parasternal Heave +ve B.Sounds, Abd Soft, No tenderness, No organomegaly appriciated, No rebound - guarding or rigidity. No Cyanosis, Clubbing or edema, No new Rash or bruise   Lab Results:  Data Reviewed: I have personally reviewed following labs and imaging studies  CBC: Recent Labs  Lab 09/15/20 0351 09/16/20 0432 09/17/20 0300 09/18/20 0211 09/19/20 0234 09/20/20 0442 09/21/20 0409  WBC 20.9*   < > 18.9* 16.1* 15.3* 15.2* 13.9*  NEUTROABS 18.4*  --   --   --  13.5* 13.6* 12.1*  HGB 15.1*   < > 15.4* 14.7 13.9 14.6 14.0  HCT 41.3   < > 43.8 40.8 38.2  40.3 38.5  MCV 77.9*   < > 80.2 78.3* 77.8* 77.9* 77.5*  PLT 512*   < > 356 373 346 324 274   < > = values in this interval not displayed.    Basic Metabolic Panel: Recent Labs  Lab 09/15/20 0351 09/16/20 0432 09/17/20 0300 09/18/20 0211 09/18/20 0901 09/19/20 0234 09/20/20 0442 09/21/20 0409  NA 141   < > 137 136  --  139 135 134*  K 4.1   < > 4.3 4.0  --  4.3 5.5* 3.4*  CL 109   < > 104 103  --  105 102 94*  CO2 19*   < > 22 21*  --  21* 21* 27  GLUCOSE 210*   < > 166* 151*  --  142* 131* 108*  BUN 35*   < > 40* 40*  --  38* 41* 29*  CREATININE 0.81   < > 1.01* 0.96  --  1.00 0.87 0.99  CALCIUM 9.4   < > 9.2 9.1  --  8.9 9.1 8.8*  MG 2.0  --   --   --  2.3 2.3 2.5* 2.2   < > = values in this interval not displayed.    GFR: Estimated Creatinine Clearance: 51.7 mL/min (by C-G formula based on SCr of 0.99 mg/dL).  Liver Function Tests: Recent Labs  Lab 09/15/20 0351 09/19/20 0234 09/20/20 0442 09/21/20 0409  AST 26 39 72* 49*  ALT 33 54* 104* 115*  ALKPHOS 74 64 69 72  BILITOT 0.5 0.8 1.8* 0.9  PROT 7.5 5.7* 6.0* 6.2*  ALBUMIN 2.6* 2.5* 2.7* 2.8*     No results found for this or any previous visit (from the  past 240 hour(s)).    Radiology Studies: DG Chest Port 1 View  Result Date: 09/20/2020 CLINICAL DATA:  Shortness of breath.  COVID-19 positive EXAM: PORTABLE CHEST 1 VIEW COMPARISON:  September 16, 2020 FINDINGS: There is airspace opacity in each lower lung region which overall is somewhat increased bilaterally. Heart size is upper normal with pulmonary vascularity normal. No adenopathy. No bone lesions. IMPRESSION: Increased airspace opacity in each lung base consistent with multifocal pneumonia. Atypical organism pneumonia suspected in this circumstance, particularly given the history. Stable cardiac silhouette. No adenopathy evident. Electronically Signed   By: Bretta Bang III M.D.   On: 09/20/2020 09:18   US Abdomen Limited RUQ  Result Date:  09/20/2020 CLINICAL DATA:  Elevated LFTs EXAM: ULTRASOUND ABDOMEN LIMITED RIGHT UPPER QUADRANT COMPARISON:  None. FINDINGS: Gallbladder: No gallstones or wall thickening visualized. No sonographic Murphy sign noted by sonographer. Common bile duct: Diameter: 5.2 mm Liver: No focal lesion identified. Within normal limits in parenchymal echogenicity. Portal vein is patent on color Doppler imaging with normal direction of blood flow towards the liver. Other: None. IMPRESSION: No acute abnormality noted. Electronically Signed   By: Alcide Clever M.D.   On: 09/20/2020 19:48       LOS: 11 days   Sankalp Ferrell  [facility] Pager on [email or web address]  09/21/2020, 9:14 AM

## 2020-09-21 NOTE — Progress Notes (Signed)
RT contacted by MD to place pt on HHFNC.  Pt SPO2 has been in 90s each time ive seen her today.  Pt in no distress.  Patient was on partial rebreather and 15L [place] HFNC, SPO2 100% HR 74 COVID Protocol was being followed.   Pt resting comfortably in bed NAD. PER MDorder pt was placed on [facility]  MD concerned patient will code if pt removes mask.  FIO2 to be titrated per order.

## 2020-09-22 DIAGNOSIS — J9601 Acute respiratory failure with hypoxia: Secondary | ICD-10-CM | POA: Diagnosis not present

## 2020-09-22 DIAGNOSIS — U071 COVID-19: Secondary | ICD-10-CM | POA: Diagnosis not present

## 2020-09-22 LAB — COMPREHENSIVE METABOLIC PANEL
ALT: 84 U/L — ABNORMAL HIGH (ref 0–44)
AST: 41 U/L (ref 15–41)
Albumin: 2.6 g/dL — ABNORMAL LOW (ref 3.5–5.0)
Alkaline Phosphatase: 68 U/L (ref 38–126)
Anion gap: 11 (ref 5–15)
BUN: 22 mg/dL (ref 8–23)
CO2: 26 mmol/L (ref 22–32)
Calcium: 8.8 mg/dL — ABNORMAL LOW (ref 8.9–10.3)
Chloride: 97 mmol/L — ABNORMAL LOW (ref 98–111)
Creatinine, Ser: 0.81 mg/dL (ref 0.44–1.00)
GFR, Estimated: >60 mL/min (ref >60)
Glucose, Bld: 100 mg/dL — ABNORMAL HIGH (ref 70–99)
Potassium: 3.9 mmol/L (ref 3.5–5.1)
Sodium: 134 mmol/L — ABNORMAL LOW (ref 135–145)
Total Bilirubin: 0.7 mg/dL (ref 0.3–1.2)
Total Protein: 5.8 g/dL — ABNORMAL LOW (ref 6.5–8.1)

## 2020-09-22 LAB — CBC WITH DIFFERENTIAL/PLATELET
Abs Immature Granulocytes: 0.15 10*3/uL — ABNORMAL HIGH (ref 0.00–0.07)
Basophils Absolute: 0 10*3/uL (ref 0.0–0.1)
Basophils Relative: 0 %
Eosinophils Absolute: 0 10*3/uL (ref 0.0–0.5)
Eosinophils Relative: 0 %
HCT: 35.2 % — ABNORMAL LOW (ref 36.0–46.0)
Hemoglobin: 12.8 g/dL (ref 12.0–15.0)
Immature Granulocytes: 1 %
Lymphocytes Relative: 8 %
Lymphs Abs: 1 10*3/uL (ref 0.7–4.0)
MCH: 28.3 pg (ref 26.0–34.0)
MCHC: 36.4 g/dL — ABNORMAL HIGH (ref 30.0–36.0)
MCV: 77.7 fL — ABNORMAL LOW (ref 80.0–100.0)
Monocytes Absolute: 0.8 10*3/uL (ref 0.1–1.0)
Monocytes Relative: 6 %
Neutro Abs: 10.8 10*3/uL — ABNORMAL HIGH (ref 1.7–7.7)
Neutrophils Relative %: 85 %
Platelets: 263 10*3/uL (ref 150–400)
RBC: 4.53 MIL/uL (ref 3.87–5.11)
RDW: 12.1 % (ref 11.5–15.5)
WBC: 12.7 10*3/uL — ABNORMAL HIGH (ref 4.0–10.5)
nRBC: 0 % (ref 0.0–0.2)

## 2020-09-22 LAB — BRAIN NATRIURETIC PEPTIDE: B Natriuretic Peptide: 50 pg/mL (ref 0.0–100.0)

## 2020-09-22 LAB — D-DIMER, QUANTITATIVE: D-Dimer, Quant: 2.93 ug/mL-FEU — ABNORMAL HIGH (ref 0.00–0.50)

## 2020-09-22 LAB — GLUCOSE, CAPILLARY
Glucose-Capillary: 94 mg/dL (ref 70–99)
Glucose-Capillary: 117 mg/dL — ABNORMAL HIGH (ref 70–99)
Glucose-Capillary: 232 mg/dL — ABNORMAL HIGH (ref 70–99)
Glucose-Capillary: 204 mg/dL — ABNORMAL HIGH (ref 70–99)

## 2020-09-22 LAB — MAGNESIUM: Magnesium: 2.1 mg/dL (ref 1.7–2.4)

## 2020-09-22 LAB — C-REACTIVE PROTEIN: CRP: 3.1 mg/dL — ABNORMAL HIGH (ref <1.0)

## 2020-09-22 MED ORDER — ENSURE ENLIVE PO LIQD
237.0000 mL | Freq: Three times a day (TID) | ORAL | Status: DC
  Administered 2020-09-22 – 2020-10-04 (×26): 237 mL via ORAL
  Filled 2020-09-22 (×5): qty 237

## 2020-09-22 MED ORDER — ADULT MULTIVITAMIN W/MINERALS CH
1.0000 | ORAL_TABLET | Freq: Every day | ORAL | Status: DC
  Administered 2020-09-22 – 2020-10-04 (×13): 1 via ORAL
  Filled 2020-09-22 (×15): qty 1

## 2020-09-22 MED ORDER — FUROSEMIDE 40 MG PO TABS
40.0000 mg | ORAL_TABLET | Freq: Once | ORAL | Status: AC
  Administered 2020-09-22: 40 mg via ORAL
  Filled 2020-09-22: qty 1

## 2020-09-22 NOTE — Progress Notes (Signed)
Initial Nutrition Assessment  DOCUMENTATION CODES:   Not applicable  INTERVENTION:   Recommend liberalizing diet to regular  Ensure Enlive po TID, each supplement provides 350 kcal and 20 grams of protein  Magic cup TID with meals, each supplement provides 290 kcal and 9 grams of protein  MVI daily   NUTRITION DIAGNOSIS:   Increased nutrient needs related to catabolic illness (COVID-19) as evidenced by estimated needs.    GOAL:   Patient will meet greater than or equal to 90% of their needs    MONITOR:   PO intake, Supplement acceptance, Weight trends, Labs, I & O's  REASON FOR ASSESSMENT:   LOS    ASSESSMENT:   Pt admitted with acute hypoxemic respiratory failure 2/2 COVID-19 PNA. Pt also with BLE DVT and presumed PE.  PMH includes HTN.  Pt has been encouraged to self prone.   Pt reports poor appetite since 4 days PTA, but states appetite is improving. Only 4 meals have been documented since pt has been admitted -- documented as 10-25% completion (17.5% average meal intake). Will order oral nutrition supplements to provide pt with additional kcals/protein.  Limited wt history available for review. Per wt readings, pt has lost 8.6% body weight since March 2018, which is insignificant for time frame.  No UOP documented.   Labs: Na 134 (L), CBGs 113-186 Medications: vitamin c, pepcid, novolog, lantus, solu-medrol, zinc sulfate    Diet Order:   Diet Order            Diet 2 gram sodium Room service appropriate? Yes; Fluid consistency: Thin  Diet effective now                 EDUCATION NEEDS:   Not appropriate for education at this time  Skin:  Skin Assessment: Reviewed RN Assessment  Last BM:  10/9  Height:   Ht Readings from Last 1 Encounters:  09/11/20 5\' 5"  (1.651 m)    Weight:   Wt Readings from Last 1 Encounters:  09/11/20 76.2 kg    BMI:  Body mass index is 27.94 kg/m.  Estimated Nutritional Needs:   Kcal:   1900-2100  Protein:  95-105 grams  Fluid:  >/=1.9L/d    11/11/20, MS, RD, LDN RD pager number and weekend/on-call pager number located in [place].

## 2020-09-22 NOTE — Plan of Care (Signed)
°  Problem: Education: Goal: Knowledge of General Education information will improve Description: Including pain rating scale, medication(s)/side effects and non-pharmacologic comfort measures Outcome: Progressing   Problem: Clinical Measurements: Goal: Will remain free from infection Outcome: Progressing   Problem: Clinical Measurements: Goal: Respiratory complications will improve Outcome: Progressing   Problem: Clinical Measurements: Goal: Cardiovascular complication will be avoided Outcome: Progressing   Problem: Nutrition: Goal: Adequate nutrition will be maintained Outcome: Progressing   Problem: Coping: Goal: Level of anxiety will decrease Outcome: Progressing   Problem: Pain Managment: Goal: General experience of comfort will improve Outcome: Progressing   Problem: Safety: Goal: Ability to remain free from injury will improve Outcome: Progressing   Problem: Skin Integrity: Goal: Risk for impaired skin integrity will decrease Outcome: Progressing

## 2020-09-22 NOTE — Progress Notes (Signed)
PROGRESS NOTE  Hannah Torres KNL:[id number] DOB: 1946/04/24 DOA: 10/01/2020  PCP: Pcp, No  Brief History/Interval Summary: 74 y.o. female with medical history significant of hypertension, former smoker presented to emergency department with worsening shortness of breath since 4 days.  She is not vaccinated against COVID-19.  Patient was noted to be hypoxic in the emergency department.  Chest x-ray showed bilateral opacities.  Patient was hospitalized for further management.  Reason for Visit: Pneumonia due to COVID-19.  Acute respiratory failure with hypoxia  Consultants: None  Procedures: None  Antibiotics: Anti-infectives (From admission, onward)   Start     Dose/Rate Route Frequency Ordered Stop   09/11/20 1000  remdesivir 100 mg in sodium chloride 0.9 % 100 mL IVPB       "Followed by" Linked Group Details   100 mg 200 mL/hr over 30 Minutes Intravenous Daily 09/15/2020 1815 09/14/20 1134   09/14/2020 2000  remdesivir 200 mg in sodium chloride 0.9% 250 mL IVPB       "Followed by" Linked Group Details   200 mg 580 mL/hr over 30 Minutes Intravenous Once 09/26/2020 1815 10/01/2020 2228   09/28/2020 1530  cefTRIAXone (ROCEPHIN) 2 g in sodium chloride 0.9 % 100 mL IVPB  Status:  Discontinued        2 g 200 mL/hr over 30 Minutes Intravenous Every 24 hours 10/04/2020 1517 09/11/20 0704   10/04/2020 1530  azithromycin (ZITHROMAX) 500 mg in sodium chloride 0.9 % 250 mL IVPB  Status:  Discontinued        500 mg 250 mL/hr over 60 Minutes Intravenous Every 24 hours 10/07/2020 1517 09/11/20 0704      Subjective/Interval History:   Patient in bed, appears comfortable, denies any headache, no fever, no chest pain or pressure, improved shortness of breath , no abdominal pain. No focal weakness.   Assessment/Plan:  Acute Hypoxic Resp. Failure/Pneumonia due to COVID-19 + DVT and possible PE - She is unfortunately not vaccinated for Covid and has incurred severe parenchymal lung injury due to COVID-19  pneumonia, she has been treated with IV steroids, Remdesivir and Baricitinib.  IV Lasix as needed being used as well, overall still quite tenuous, on HHFL and mildly better on 09/22/20, encouraged her to sit up in chair in daytime use I-S and flutter valve for pulmonary toiletry and prone at night if possible.  SpO2: 94 % O2 Flow Rate (L/min): 30 L/min FiO2 (%): 60 %    Recent Labs  Lab 09/16/20 0432 09/18/20 0211 09/18/20 0901 09/19/20 0234 09/20/20 0442 09/21/20 0409 09/22/20 0516  WBC  --  16.1*  --  15.3* 15.2* 13.9* 12.7*  CRP  --   --   --  0.5 <0.5 <0.5 3.1*  DDIMER  --  12.07*  --  9.07* 3.82* 7.04* 2.93*  BNP   < >  --  57.9 115.4* 37.4 74.2 50.0  AST  --   --   --  39 72* 49* 41  ALT  --   --   --  54* 104* 115* 84*  ALKPHOS  --   --   --  64 69 72 68  BILITOT  --   --   --  0.8 1.8* 0.9 0.7  ALBUMIN  --   --   --  2.5* 2.7* 2.8* 2.6*   < > = values in this interval not displayed.       Acute DVT bilateral lower extremities/presumed PE -  Patient found to have a  bilateral lower extremity DVT.  Likely has acute PE as well though CT scan was not done due to her tenuous respiratory status.  Continue with therapeutic Lovenox for now.  Transition to oral agents when she is more stable.  D-dimer finally improving.  Atrial fibrillation with RVR with [place] vas 2 score of greater than 3. No previous history of same.  Stable echocardiogram and TSH, was on Cardizem infusion, and 2 doses of IV digoxin, currently appears stable on high-dose oral beta-blocker which will be continued.  Already on full dose anticoagulation due to bilateral lower DVTs and presumed PE.  Transaminitis - Secondary to COVID-19 and possibly remdesivir use.  She is asymptomatic and has stable RUQ [place] - monitor trend closely.  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  Lab Results  Component Value Date   TSH 0.183 (L) 09/18/2020      Essential hypertension - B.Blocker.  Acute kidney injury - caused by ATN from infection, resolved after hydration.  Hyperkalemia.  IV Lasix on 09/20/2020, Lokelma and Kayexalate.  Resolved.    Thrombocytosis/leukocytosis - Likely reactive due to acute infection.  Elevated WBC likely due to steroids.  Platelet counts have improved.  Hyperglycemia - No known history of diabetes.  HbA1c 6.4.  Hyperglycemia likely due to steroids.  Continue low-dose Lantus and SSI.  CBG (last 3)  Recent Labs    09/21/20 1645 09/21/20 2103 09/22/20 0803  GLUCAP 113* 186* 94      DVT Prophylaxis: On therapeutic Lovenox Code Status: Full code Family Communication: Daughter Armando Reichert [phone]  on 09/19/2020 Disposition Plan: Hopefully return home when improved  Status is: Inpatient  Remains inpatient appropriate because:IV treatments appropriate due to intensity of illness or inability to take PO and Inpatient level of care appropriate due to severity of illness   Dispo:  Patient From: Home  Planned Disposition: Home  Expected discharge date: 09/24/20  Medically stable for discharge: No     Medications:  Scheduled: . vitamin C  500 mg Oral Daily  . baricitinib  2 mg Oral Daily  . enoxaparin (LOVENOX) injection  1 mg/kg Subcutaneous Q12H  . famotidine  20 mg Oral Daily  . feeding supplement (ENSURE ENLIVE)  237 mL Oral TID BM  . furosemide  40 mg Oral Once  . influenza vaccine adjuvanted  0.5 mL Intramuscular Tomorrow-1000  . insulin aspart  0-20 Units Subcutaneous TID WC  . insulin aspart  0-5 Units Subcutaneous QHS  . insulin glargine  8 Units Subcutaneous QHS  . methylPREDNISolone (SOLU-MEDROL) injection  40 mg Intravenous Q12H  . metoprolol tartrate  50 mg Oral BID  . mometasone-formoterol  2 puff Inhalation BID  . multivitamin with minerals  1 tablet Oral Daily  . zinc sulfate  220 mg Oral Daily   Continuous:  HAL:[id number], chlorpheniramine-HYDROcodone, diltiazem,  guaiFENesin-dextromethorphan, lip balm, loperamide, [DISCONTINUED] ondansetron **OR** ondansetron (ZOFRAN) IV, phenol, sodium chloride   Objective:  Vital Signs  Vitals:   09/22/20 0329 09/22/20 0409 09/22/20 0803 09/22/20 0808  BP:  111/65 127/60 127/60  Pulse:  72 62 62  Resp: (!) 23 20 15 19   Temp:  97.9 F (36.6 C) (!) 97.4 F (36.3 C)   TempSrc:  Oral Oral   SpO2: 95% 95% 93% 94%  Weight:      Height:       No intake or output data in the 24 hours ending 09/22/20 1134 Filed  Weights   05-10-2020 2150 05-10-2020 2155 09/11/20 1649  Weight: 78.5 kg 79.4 kg 76.2 kg    Exam   Awake Alert, No new F.N deficits, Normal affect [place].AT,PERRAL Supple Neck,No JVD, No cervical lymphadenopathy appriciated.  Symmetrical Chest wall movement, Good air movement bilaterally, few rales RRR,No Gallops, Rubs or new Murmurs, No Parasternal Heave +ve B.Sounds, Abd Soft, No tenderness, No organomegaly appriciated, No rebound - guarding or rigidity. No Cyanosis, Clubbing or edema, No new Rash or bruise   Lab Results:  Data Reviewed: I have personally reviewed following labs and imaging studies  Recent Labs  Lab 09/18/20 0211 09/19/20 0234 09/20/20 0442 09/21/20 0409 09/22/20 0516  WBC 16.1* 15.3* 15.2* 13.9* 12.7*  HGB 14.7 13.9 14.6 14.0 12.8  HCT 40.8 38.2 40.3 38.5 35.2*  PLT 373 346 324 274 263  MCV 78.3* 77.8* 77.9* 77.5* 77.7*  MCH 28.2 28.3 28.2 28.2 28.3  MCHC 36.0 36.4* 36.2* 36.4* 36.4*  RDW 12.4 12.3 12.3 12.2 12.1  LYMPHSABS  --  1.0 0.9 1.0 1.0  MONOABS  --  0.6 0.6 0.7 0.8  EOSABS  --  0.0 0.0 0.0 0.0  BASOSABS  --  0.0 0.0 0.0 0.0    Recent Labs  Lab 09/16/20 0432 09/18/20 0211 09/18/20 0901 09/19/20 0234 09/20/20 0442 09/21/20 0409 09/22/20 0516  NA  --  136  --  139 135 134* 134*  K  --  4.0  --  4.3 5.5* 3.4* 3.9  CL  --  103  --  105 102 94* 97*  CO2  --  21*  --  21* 21* 27 26  GLUCOSE  --  151*  --  142* 131* 108* 100*  BUN  --  40*  --  38* 41*  29* 22  CREATININE  --  0.96  --  1.00 0.87 0.99 0.81  CALCIUM  --  9.1  --  8.9 9.1 8.8* 8.8*  AST  --   --   --  39 72* 49* 41  ALT  --   --   --  54* 104* 115* 84*  ALKPHOS  --   --   --  64 69 72 68  BILITOT  --   --   --  0.8 1.8* 0.9 0.7  ALBUMIN  --   --   --  2.5* 2.7* 2.8* 2.6*  MG  --   --  2.3 2.3 2.5* 2.2 2.1  CRP  --   --   --  0.5 <0.5 <0.5 3.1*  DDIMER  --  12.07*  --  9.07* 3.82* 7.04* 2.93*  TSH  --  0.183*  --   --   --   --   --   BNP   < >  --  57.9 115.4* 37.4 74.2 50.0   < > = values in this interval not displayed.     No results found for this or any previous visit (from the past 240 hour(s)).    Radiology Studies: US Abdomen Limited RUQ  Result Date: 09/20/2020 CLINICAL DATA:  Elevated LFTs EXAM: ULTRASOUND ABDOMEN LIMITED RIGHT UPPER QUADRANT COMPARISON:  None. FINDINGS: Gallbladder: No gallstones or wall thickening visualized. No sonographic Murphy sign noted by sonographer. Common bile duct: Diameter: 5.2 mm Liver: No focal lesion identified. Within normal limits in parenchymal echogenicity. Portal vein is patent on color Doppler imaging with normal direction of blood flow towards the liver. Other: None. IMPRESSION: No acute abnormality noted. Electronically Signed  By: Alcide Clever M.D.   On: 09/20/2020 19:48       LOS: 12 days   Deaken Jurgens  [facility] Pager on [email or web address]  09/22/2020, 11:34 AM

## 2020-09-23 ENCOUNTER — Inpatient Hospital Stay (HOSPITAL_COMMUNITY): Payer: Medicare PPO

## 2020-09-23 DIAGNOSIS — J9601 Acute respiratory failure with hypoxia: Secondary | ICD-10-CM | POA: Diagnosis not present

## 2020-09-23 DIAGNOSIS — U071 COVID-19: Secondary | ICD-10-CM | POA: Diagnosis not present

## 2020-09-23 LAB — COMPREHENSIVE METABOLIC PANEL
ALT: 79 U/L — ABNORMAL HIGH (ref 0–44)
AST: 39 U/L (ref 15–41)
Albumin: 2.5 g/dL — ABNORMAL LOW (ref 3.5–5.0)
Alkaline Phosphatase: 76 U/L (ref 38–126)
Anion gap: 13 (ref 5–15)
BUN: 23 mg/dL (ref 8–23)
CO2: 28 mmol/L (ref 22–32)
Calcium: 8.7 mg/dL — ABNORMAL LOW (ref 8.9–10.3)
Chloride: 93 mmol/L — ABNORMAL LOW (ref 98–111)
Creatinine, Ser: 0.93 mg/dL (ref 0.44–1.00)
GFR, Estimated: >60 mL/min (ref >60)
Glucose, Bld: 189 mg/dL — ABNORMAL HIGH (ref 70–99)
Potassium: 3.5 mmol/L (ref 3.5–5.1)
Sodium: 134 mmol/L — ABNORMAL LOW (ref 135–145)
Total Bilirubin: 0.8 mg/dL (ref 0.3–1.2)
Total Protein: 5.9 g/dL — ABNORMAL LOW (ref 6.5–8.1)

## 2020-09-23 LAB — CBC WITH DIFFERENTIAL/PLATELET
Abs Immature Granulocytes: 0.24 10*3/uL — ABNORMAL HIGH (ref 0.00–0.07)
Basophils Absolute: 0 10*3/uL (ref 0.0–0.1)
Basophils Relative: 0 %
Eosinophils Absolute: 0 10*3/uL (ref 0.0–0.5)
Eosinophils Relative: 0 %
HCT: 32.6 % — ABNORMAL LOW (ref 36.0–46.0)
Hemoglobin: 12.1 g/dL (ref 12.0–15.0)
Immature Granulocytes: 2 %
Lymphocytes Relative: 7 %
Lymphs Abs: 1 10*3/uL (ref 0.7–4.0)
MCH: 28.5 pg (ref 26.0–34.0)
MCHC: 37.1 g/dL — ABNORMAL HIGH (ref 30.0–36.0)
MCV: 76.7 fL — ABNORMAL LOW (ref 80.0–100.0)
Monocytes Absolute: 0.8 10*3/uL (ref 0.1–1.0)
Monocytes Relative: 5 %
Neutro Abs: 13.7 10*3/uL — ABNORMAL HIGH (ref 1.7–7.7)
Neutrophils Relative %: 86 %
Platelets: 254 10*3/uL (ref 150–400)
RBC: 4.25 MIL/uL (ref 3.87–5.11)
RDW: 12.1 % (ref 11.5–15.5)
WBC: 15.8 10*3/uL — ABNORMAL HIGH (ref 4.0–10.5)
nRBC: 0 % (ref 0.0–0.2)

## 2020-09-23 LAB — GLUCOSE, CAPILLARY
Glucose-Capillary: 136 mg/dL — ABNORMAL HIGH (ref 70–99)
Glucose-Capillary: 159 mg/dL — ABNORMAL HIGH (ref 70–99)
Glucose-Capillary: 186 mg/dL — ABNORMAL HIGH (ref 70–99)
Glucose-Capillary: 139 mg/dL — ABNORMAL HIGH (ref 70–99)

## 2020-09-23 LAB — D-DIMER, QUANTITATIVE: D-Dimer, Quant: 1.87 ug/mL-FEU — ABNORMAL HIGH (ref 0.00–0.50)

## 2020-09-23 LAB — C-REACTIVE PROTEIN: CRP: 2.3 mg/dL — ABNORMAL HIGH (ref <1.0)

## 2020-09-23 LAB — BRAIN NATRIURETIC PEPTIDE: B Natriuretic Peptide: 45 pg/mL (ref 0.0–100.0)

## 2020-09-23 LAB — MAGNESIUM: Magnesium: 2 mg/dL (ref 1.7–2.4)

## 2020-09-23 LAB — PROCALCITONIN: Procalcitonin: 0.16 ng/mL

## 2020-09-23 MED ORDER — POTASSIUM CHLORIDE CRYS ER 20 MEQ PO TBCR
40.0000 meq | EXTENDED_RELEASE_TABLET | Freq: Once | ORAL | Status: AC
  Administered 2020-09-23: 40 meq via ORAL
  Filled 2020-09-23: qty 2

## 2020-09-23 MED ORDER — FUROSEMIDE 40 MG PO TABS
40.0000 mg | ORAL_TABLET | Freq: Once | ORAL | Status: AC
  Administered 2020-09-23: 40 mg via ORAL
  Filled 2020-09-23: qty 1

## 2020-09-23 MED ORDER — APIXABAN 5 MG PO TABS
5.0000 mg | ORAL_TABLET | Freq: Two times a day (BID) | ORAL | Status: DC
  Administered 2020-09-24 – 2020-10-04 (×22): 5 mg via ORAL
  Filled 2020-09-23 (×25): qty 1

## 2020-09-23 NOTE — Progress Notes (Signed)
PROGRESS NOTE  Hannah Torres:[id number] DOB: Oct 12, 1946 DOA: 2020-09-27  PCP: Pcp, No  Brief History/Interval Summary: 74 y.o. female with medical history significant of hypertension, former smoker presented to emergency department with worsening shortness of breath since 4 days.  She is not vaccinated against COVID-19.  Patient was noted to be hypoxic in the emergency department.  Chest x-ray showed bilateral opacities.  Patient was hospitalized for further management.  Reason for Visit: Pneumonia due to COVID-19.  Acute respiratory failure with hypoxia  Consultants: None  Procedures: None  Antibiotics: Anti-infectives (From admission, onward)   Start     Dose/Rate Route Frequency Ordered Stop   09/11/20 1000  remdesivir 100 mg in sodium chloride 0.9 % 100 mL IVPB       "Followed by" Linked Group Details   100 mg 200 mL/hr over 30 Minutes Intravenous Daily 09-27-20 1815 09/14/20 1134   09/27/20 2000  remdesivir 200 mg in sodium chloride 0.9% 250 mL IVPB       "Followed by" Linked Group Details   200 mg 580 mL/hr over 30 Minutes Intravenous Once September 27, 2020 1815 27-Sep-2020 2228   2020-09-27 1530  cefTRIAXone (ROCEPHIN) 2 g in sodium chloride 0.9 % 100 mL IVPB  Status:  Discontinued        2 g 200 mL/hr over 30 Minutes Intravenous Every 24 hours 09-27-2020 1517 09/11/20 0704   09/27/2020 1530  azithromycin (ZITHROMAX) 500 mg in sodium chloride 0.9 % 250 mL IVPB  Status:  Discontinued        500 mg 250 mL/hr over 60 Minutes Intravenous Every 24 hours Sep 27, 2020 1517 09/11/20 0704      Subjective/Interval History:   Patient in bed, appears comfortable, denies any headache, no fever, no chest pain or pressure, improved shortness of breath , no abdominal pain. No focal weakness.   Assessment/Plan:  Acute Hypoxic Resp. Failure/Pneumonia due to COVID-19 + DVT and possible PE - She is unfortunately not vaccinated for Covid and has incurred severe parenchymal lung injury due to COVID-19  pneumonia, she has been treated with IV steroids, Remdesivir and Baricitinib. Lasix as needed being used as well, she was switched to HHFL on 09/22/20 with ++ improvement, still tenuous but much better than before.   Encouraged her to sit up in chair in daytime use I-S and flutter valve for pulmonary toiletry and prone at night if possible.  SpO2: 95 % O2 Flow Rate (L/min): 25 L/min FiO2 (%): 40 %    Recent Labs  Lab 09/19/20 0234 09/20/20 0442 09/21/20 0409 09/22/20 0516 09/23/20 0613  WBC 15.3* 15.2* 13.9* 12.7* 15.8*  CRP 0.5 <0.5 <0.5 3.1* 2.3*  DDIMER 9.07* 3.82* 7.04* 2.93* 1.87*  BNP 115.4* 37.4 74.2 50.0 45.0  PROCALCITON  --   --   --   --  0.16  AST 39 72* 49* 41 39  ALT 54* 104* 115* 84* 79*  ALKPHOS 64 69 72 68 76  BILITOT 0.8 1.8* 0.9 0.7 0.8  ALBUMIN 2.5* 2.7* 2.8* 2.6* 2.5*       Acute DVT bilateral lower extremities/presumed PE -  Patient found to have a bilateral lower extremity DVT.  Has been on full dose Lovenox will transition her to Eliquis on 09/24/2020 as D-dimer is now trending down, treatment will be 6 months total..  Atrial fibrillation with RVR with [place] vas 2 score of greater than 3. No previous history of same.  Stable echocardiogram and TSH, was on Cardizem infusion, and 2 doses  of IV digoxin, currently appears stable on high-dose oral beta-blocker which will be continued.  Already on full dose anticoagulation due to bilateral lower DVTs and presumed PE.  Transaminitis - Secondary to COVID-19 and possibly remdesivir use.  She is asymptomatic and has stable RUQ [place] - now stable trend .  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  Lab Results  Component Value Date   TSH 0.183 (L) 09/18/2020     Essential hypertension - B.Blocker.  Acute kidney injury - caused by ATN from infection, resolved after hydration.  Thrombocytosis/leukocytosis - Likely reactive due to acute  infection.  Elevated WBC likely due to steroids.  Platelet counts have improved.  Hyperglycemia - No known history of diabetes.  HbA1c 6.4.  Hyperglycemia likely due to steroids.  Continue low-dose Lantus and SSI.  CBG (last 3)  Recent Labs    09/22/20 1636 09/22/20 2006 09/23/20 0734  GLUCAP 232* 204* 136*      DVT Prophylaxis: On therapeutic Lovenox Code Status: Full code Family Communication: Daughter Armando Reichert [phone]  on 09/19/2020, 09/23/20 Disposition Plan: Hopefully return home when improved  Status is: Inpatient  Remains inpatient appropriate because:IV treatments appropriate due to intensity of illness or inability to take PO and Inpatient level of care appropriate due to severity of illness   Dispo:  Patient From: Home  Planned Disposition: Home  Expected discharge date: 09/27/20  Medically stable for discharge: No     Medications:  Scheduled: . vitamin C  500 mg Oral Daily  . baricitinib  2 mg Oral Daily  . enoxaparin (LOVENOX) injection  1 mg/kg Subcutaneous Q12H  . famotidine  20 mg Oral Daily  . feeding supplement  237 mL Oral TID BM  . influenza vaccine adjuvanted  0.5 mL Intramuscular Tomorrow-1000  . insulin aspart  0-20 Units Subcutaneous TID WC  . insulin aspart  0-5 Units Subcutaneous QHS  . insulin glargine  8 Units Subcutaneous QHS  . methylPREDNISolone (SOLU-MEDROL) injection  40 mg Intravenous Q12H  . metoprolol tartrate  50 mg Oral BID  . mometasone-formoterol  2 puff Inhalation BID  . multivitamin with minerals  1 tablet Oral Daily  . zinc sulfate  220 mg Oral Daily   Continuous:  PJA:[id number], chlorpheniramine-HYDROcodone, diltiazem, guaiFENesin-dextromethorphan, lip balm, loperamide, [DISCONTINUED] ondansetron **OR** ondansetron (ZOFRAN) IV, phenol, sodium chloride   Objective:  Vital Signs  Vitals:   09/23/20 0332 09/23/20 0736 09/23/20 0800 09/23/20 0804  BP: (!) 128/58 130/68 (!) 147/65   Pulse:   76   Resp: (!) 22  (!) 23 (!) 22   Temp:  (!) 97.4 F (36.3 C)    TempSrc:  Oral    SpO2: 94% 99% 100% 95%  Weight:      Height:        Intake/Output Summary (Last 24 hours) at 09/23/2020 0941 Last data filed at 09/22/2020 2150 Gross per 24 hour  Intake --  Output 1100 ml  Net -1100 ml   Filed Weights   16-Sep-2020 2150 16-Sep-2020 2155 09/11/20 1649  Weight: 78.5 kg 79.4 kg 76.2 kg    Exam   Awake Alert, No new F.N deficits, Normal affect .AT,PERRAL Supple Neck,No JVD, No cervical lymphadenopathy appriciated.  Symmetrical Chest wall movement, Good air movement bilaterally, CTAB RRR,No Gallops, Rubs or new Murmurs, No Parasternal Heave +ve B.Sounds, Abd Soft, No tenderness, No organomegaly appriciated, No rebound - guarding or rigidity. No Cyanosis, Clubbing  or edema, No new Rash or bruise   Lab Results:  Data Reviewed: I have personally reviewed following labs and imaging studies  Recent Labs  Lab 09/19/20 0234 09/20/20 0442 09/21/20 0409 09/22/20 0516 09/23/20 0613  WBC 15.3* 15.2* 13.9* 12.7* 15.8*  HGB 13.9 14.6 14.0 12.8 12.1  HCT 38.2 40.3 38.5 35.2* 32.6*  PLT 346 324 274 263 254  MCV 77.8* 77.9* 77.5* 77.7* 76.7*  MCH 28.3 28.2 28.2 28.3 28.5  MCHC 36.4* 36.2* 36.4* 36.4* 37.1*  RDW 12.3 12.3 12.2 12.1 12.1  LYMPHSABS 1.0 0.9 1.0 1.0 1.0  MONOABS 0.6 0.6 0.7 0.8 0.8  EOSABS 0.0 0.0 0.0 0.0 0.0  BASOSABS 0.0 0.0 0.0 0.0 0.0    Recent Labs  Lab 09/18/20 0211 09/18/20 0901 09/19/20 0234 09/20/20 0442 09/21/20 0409 09/22/20 0516 09/23/20 0613  NA 136  --  139 135 134* 134* 134*  K 4.0  --  4.3 5.5* 3.4* 3.9 3.5  CL 103  --  105 102 94* 97* 93*  CO2 21*  --  21* 21* 27 26 28   GLUCOSE 151*  --  142* 131* 108* 100* 189*  BUN 40*  --  38* 41* 29* 22 23  CREATININE 0.96  --  1.00 0.87 0.99 0.81 0.93  CALCIUM 9.1  --  8.9 9.1 8.8* 8.8* 8.7*  AST  --   --  39 72* 49* 41 39  ALT  --   --  54* 104* 115* 84* 79*  ALKPHOS  --   --  64 69 72 68 76  BILITOT  --   --  0.8  1.8* 0.9 0.7 0.8  ALBUMIN  --   --  2.5* 2.7* 2.8* 2.6* 2.5*  MG  --    < > 2.3 2.5* 2.2 2.1 2.0  CRP  --   --  0.5 <0.5 <0.5 3.1* 2.3*  DDIMER 12.07*  --  9.07* 3.82* 7.04* 2.93* 1.87*  PROCALCITON  --   --   --   --   --   --  0.16  TSH 0.183*  --   --   --   --   --   --   BNP  --    < > 115.4* 37.4 74.2 50.0 45.0   < > = values in this interval not displayed.     No results found for this or any previous visit (from the past 240 hour(s)).    Radiology Studies: DG Chest Port 1 View  Result Date: 09/23/2020 CLINICAL DATA:  Shortness of breath.  COVID-19 virus infection. EXAM: PORTABLE CHEST 1 VIEW COMPARISON:  09/20/2020 FINDINGS: Heart size is stable. Persistent infiltrates are seen in both lung bases, with mild worsening in the left lower lobe since prior study. No definite pleural effusion or pneumothorax visualized. IMPRESSION: Bibasilar pulmonary infiltrates, with mild worsening in left lower lobe since prior study. Electronically Signed   By: 11/20/2020 M.D.   On: 09/23/2020 08:05       LOS: 13 days   Congetta Odriscoll  [facility] Pager on [email or web address]  09/23/2020, 9:41 AM

## 2020-09-24 DIAGNOSIS — U071 COVID-19: Secondary | ICD-10-CM | POA: Diagnosis not present

## 2020-09-24 DIAGNOSIS — J9601 Acute respiratory failure with hypoxia: Secondary | ICD-10-CM | POA: Diagnosis not present

## 2020-09-24 LAB — COMPREHENSIVE METABOLIC PANEL
ALT: 78 U/L — ABNORMAL HIGH (ref 0–44)
AST: 39 U/L (ref 15–41)
Albumin: 2.5 g/dL — ABNORMAL LOW (ref 3.5–5.0)
Alkaline Phosphatase: 71 U/L (ref 38–126)
Anion gap: 13 (ref 5–15)
BUN: 23 mg/dL (ref 8–23)
CO2: 28 mmol/L (ref 22–32)
Calcium: 9 mg/dL (ref 8.9–10.3)
Chloride: 92 mmol/L — ABNORMAL LOW (ref 98–111)
Creatinine, Ser: 0.86 mg/dL (ref 0.44–1.00)
GFR, Estimated: >60 mL/min (ref >60)
Glucose, Bld: 209 mg/dL — ABNORMAL HIGH (ref 70–99)
Potassium: 3.6 mmol/L (ref 3.5–5.1)
Sodium: 133 mmol/L — ABNORMAL LOW (ref 135–145)
Total Bilirubin: 0.9 mg/dL (ref 0.3–1.2)
Total Protein: 6 g/dL — ABNORMAL LOW (ref 6.5–8.1)

## 2020-09-24 LAB — PROCALCITONIN: Procalcitonin: <0.1 ng/mL

## 2020-09-24 LAB — CBC WITH DIFFERENTIAL/PLATELET
Abs Immature Granulocytes: 0.32 10*3/uL — ABNORMAL HIGH (ref 0.00–0.07)
Basophils Absolute: 0 10*3/uL (ref 0.0–0.1)
Basophils Relative: 0 %
Eosinophils Absolute: 0 10*3/uL (ref 0.0–0.5)
Eosinophils Relative: 0 %
HCT: 33.1 % — ABNORMAL LOW (ref 36.0–46.0)
Hemoglobin: 11.9 g/dL — ABNORMAL LOW (ref 12.0–15.0)
Immature Granulocytes: 2 %
Lymphocytes Relative: 7 %
Lymphs Abs: 1 10*3/uL (ref 0.7–4.0)
MCH: 27.8 pg (ref 26.0–34.0)
MCHC: 36 g/dL (ref 30.0–36.0)
MCV: 77.3 fL — ABNORMAL LOW (ref 80.0–100.0)
Monocytes Absolute: 0.4 10*3/uL (ref 0.1–1.0)
Monocytes Relative: 3 %
Neutro Abs: 12.7 10*3/uL — ABNORMAL HIGH (ref 1.7–7.7)
Neutrophils Relative %: 88 %
Platelets: 269 10*3/uL (ref 150–400)
RBC: 4.28 MIL/uL (ref 3.87–5.11)
RDW: 12.1 % (ref 11.5–15.5)
WBC: 14.4 10*3/uL — ABNORMAL HIGH (ref 4.0–10.5)
nRBC: 0 % (ref 0.0–0.2)

## 2020-09-24 LAB — MAGNESIUM: Magnesium: 2 mg/dL (ref 1.7–2.4)

## 2020-09-24 LAB — C-REACTIVE PROTEIN: CRP: 1.3 mg/dL — ABNORMAL HIGH (ref <1.0)

## 2020-09-24 LAB — GLUCOSE, CAPILLARY
Glucose-Capillary: 148 mg/dL — ABNORMAL HIGH (ref 70–99)
Glucose-Capillary: 230 mg/dL — ABNORMAL HIGH (ref 70–99)
Glucose-Capillary: 125 mg/dL — ABNORMAL HIGH (ref 70–99)
Glucose-Capillary: 146 mg/dL — ABNORMAL HIGH (ref 70–99)

## 2020-09-24 LAB — D-DIMER, QUANTITATIVE: D-Dimer, Quant: 1.48 ug/mL-FEU — ABNORMAL HIGH (ref 0.00–0.50)

## 2020-09-24 LAB — BRAIN NATRIURETIC PEPTIDE: B Natriuretic Peptide: 44.3 pg/mL (ref 0.0–100.0)

## 2020-09-24 NOTE — Progress Notes (Signed)
RT NOTES: Called to room by RN stating patient was desaturating to the 50s, RN increased fio2 and flow. Patient now on 35L/50% HHFNC and sating 97%. Will continue to monitor.

## 2020-09-24 NOTE — Progress Notes (Signed)
PROGRESS NOTE  Hannah Torres DDU:[id number] DOB: 1946-03-26 DOA: 09/13/2020  PCP: Pcp, No  Brief History/Interval Summary: 74 y.o. female with medical history significant of hypertension, former smoker presented to emergency department with worsening shortness of breath since 4 days.  She is not vaccinated against COVID-19.  Patient was noted to be hypoxic in the emergency department.  Chest x-ray showed bilateral opacities.  Patient was hospitalized for further management.  Reason for Visit: Pneumonia due to COVID-19.  Acute respiratory failure with hypoxia  Consultants: None  Procedures: None  Antibiotics: Anti-infectives (From admission, onward)   Start     Dose/Rate Route Frequency Ordered Stop   09/11/20 1000  remdesivir 100 mg in sodium chloride 0.9 % 100 mL IVPB       "Followed by" Linked Group Details   100 mg 200 mL/hr over 30 Minutes Intravenous Daily 09/21/2020 1815 09/14/20 1134   09/12/2020 2000  remdesivir 200 mg in sodium chloride 0.9% 250 mL IVPB       "Followed by" Linked Group Details   200 mg 580 mL/hr over 30 Minutes Intravenous Once 10/01/2020 1815 09/11/2020 2228   10/01/2020 1530  cefTRIAXone (ROCEPHIN) 2 g in sodium chloride 0.9 % 100 mL IVPB  Status:  Discontinued        2 g 200 mL/hr over 30 Minutes Intravenous Every 24 hours 09/27/2020 1517 09/11/20 0704   10/04/2020 1530  azithromycin (ZITHROMAX) 500 mg in sodium chloride 0.9 % 250 mL IVPB  Status:  Discontinued        500 mg 250 mL/hr over 60 Minutes Intravenous Every 24 hours 09/17/2020 1517 09/11/20 0704      Subjective/Interval History:   Patient in bed, appears comfortable, denies any headache, no fever, no chest pain or pressure, no shortness of breath , no abdominal pain. No focal weakness.   Assessment/Plan:  Acute Hypoxic Resp. Failure/Pneumonia due to COVID-19 + DVT and possible PE - She is unfortunately not vaccinated for Covid and has incurred severe parenchymal lung injury due to COVID-19 pneumonia,  she has been treated with IV steroids, Remdesivir and Baricitinib. Lasix as needed being used as well, she was switched to HHFL on 09/22/20 with ++ improvement, still tenuous but much better than before.   Encouraged her to sit up in chair in daytime use I-S and flutter valve for pulmonary toiletry and prone at night if possible.  SpO2: 99 % O2 Flow Rate (L/min): 25 L/min FiO2 (%): 40 %    Recent Labs  Lab 09/20/20 0442 09/21/20 0409 09/22/20 0516 09/23/20 0613 09/24/20 0318  WBC 15.2* 13.9* 12.7* 15.8* 14.4*  CRP <0.5 <0.5 3.1* 2.3* 1.3*  DDIMER 3.82* 7.04* 2.93* 1.87* 1.48*  BNP 37.4 74.2 50.0 45.0 44.3  PROCALCITON  --   --   --  0.16 <0.10  AST 72* 49* 41 39 39  ALT 104* 115* 84* 79* 78*  ALKPHOS 69 72 68 76 71  BILITOT 1.8* 0.9 0.7 0.8 0.9  ALBUMIN 2.7* 2.8* 2.6* 2.5* 2.5*       Acute DVT bilateral lower extremities/presumed PE -  Patient found to have a bilateral lower extremity DVT.  Has been on full dose Lovenox will transition her to Eliquis on 09/24/2020 as D-dimer is now trending down, treatment will be 6 months total..  Atrial fibrillation with RVR with [place] vas 2 score of greater than 3. No previous history of same.  Stable echocardiogram and TSH, was on Cardizem infusion, and 2 doses of IV  digoxin, currently appears stable on high-dose oral beta-blocker which will be continued.  Already on full dose anticoagulation due to bilateral lower DVTs and presumed PE.  Transaminitis - Secondary to COVID-19 and possibly remdesivir use.  She is asymptomatic and has stable RUQ [place] - now stable trend .  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  Lab Results  Component Value Date   TSH 0.183 (L) 09/18/2020     Essential hypertension - B.Blocker.  Acute kidney injury - caused by ATN from infection, resolved after hydration.  Thrombocytosis/leukocytosis - Likely reactive due to acute infection.   Elevated WBC likely due to steroids.  Platelet counts have improved.  Hyperglycemia - No known history of diabetes.  HbA1c 6.4.  Hyperglycemia likely due to steroids.  Continue low-dose Lantus and SSI.  CBG (last 3)  Recent Labs    09/23/20 1624 09/23/20 2038 09/24/20 0726  GLUCAP 186* 139* 148*      DVT Prophylaxis: On therapeutic Lovenox Code Status: Full code Family Communication: Daughter Armando Reichert [phone]  on 09/19/2020, 09/23/20 Disposition Plan: Hopefully return home when improved  Status is: Inpatient  Remains inpatient appropriate because:IV treatments appropriate due to intensity of illness or inability to take PO and Inpatient level of care appropriate due to severity of illness   Dispo:  Patient From: Home  Planned Disposition: Home  Expected discharge date: 09/27/20  Medically stable for discharge: No     Medications:  Scheduled: . apixaban  5 mg Oral BID  . vitamin C  500 mg Oral Daily  . famotidine  20 mg Oral Daily  . feeding supplement  237 mL Oral TID BM  . influenza vaccine adjuvanted  0.5 mL Intramuscular Tomorrow-1000  . insulin aspart  0-20 Units Subcutaneous TID WC  . insulin aspart  0-5 Units Subcutaneous QHS  . insulin glargine  8 Units Subcutaneous QHS  . methylPREDNISolone (SOLU-MEDROL) injection  40 mg Intravenous Q12H  . metoprolol tartrate  50 mg Oral BID  . mometasone-formoterol  2 puff Inhalation BID  . multivitamin with minerals  1 tablet Oral Daily  . zinc sulfate  220 mg Oral Daily   Continuous:  QVZ:[id number], chlorpheniramine-HYDROcodone, diltiazem, guaiFENesin-dextromethorphan, lip balm, loperamide, [DISCONTINUED] ondansetron **OR** ondansetron (ZOFRAN) IV, phenol, sodium chloride   Objective:  Vital Signs  Vitals:   09/24/20 0400 09/24/20 0725 09/24/20 0800 09/24/20 0818  BP: 127/68 120/75 (!) 120/56   Pulse:  93  (!) 101  Resp: 18 15 18    Temp:  97.7 F (36.5 C)    TempSrc:  Oral    SpO2: 100% 100% 99%     Weight:      Height:        Intake/Output Summary (Last 24 hours) at 09/24/2020 0925 Last data filed at 09/23/2020 2300 Gross per 24 hour  Intake --  Output 900 ml  Net -900 ml   Filed Weights   2020-09-18 2150 09/18/2020 2155 09/11/20 1649  Weight: 78.5 kg 79.4 kg 76.2 kg    Exam   Awake Alert, No new F.N deficits, Normal affect [place].AT,PERRAL Supple Neck,No JVD, No cervical lymphadenopathy appriciated.  Symmetrical Chest wall movement, Good air movement bilaterally, CTAB RRR,No Gallops, Rubs or new Murmurs, No Parasternal Heave +ve B.Sounds, Abd Soft, No tenderness, No organomegaly appriciated, No rebound - guarding or rigidity. No Cyanosis, Clubbing or edema, No new Rash or bruise   Lab Results:  Data Reviewed: I  have personally reviewed following labs and imaging studies  Recent Labs  Lab 09/20/20 0442 09/21/20 0409 09/22/20 0516 09/23/20 0613 09/24/20 0318  WBC 15.2* 13.9* 12.7* 15.8* 14.4*  HGB 14.6 14.0 12.8 12.1 11.9*  HCT 40.3 38.5 35.2* 32.6* 33.1*  PLT 324 274 263 254 269  MCV 77.9* 77.5* 77.7* 76.7* 77.3*  MCH 28.2 28.2 28.3 28.5 27.8  MCHC 36.2* 36.4* 36.4* 37.1* 36.0  RDW 12.3 12.2 12.1 12.1 12.1  LYMPHSABS 0.9 1.0 1.0 1.0 1.0  MONOABS 0.6 0.7 0.8 0.8 0.4  EOSABS 0.0 0.0 0.0 0.0 0.0  BASOSABS 0.0 0.0 0.0 0.0 0.0    Recent Labs  Lab 09/18/20 0211 09/18/20 0901 09/20/20 0442 09/21/20 0409 09/22/20 0516 09/23/20 0613 09/24/20 0318  NA 136   < > 135 134* 134* 134* 133*  K 4.0   < > 5.5* 3.4* 3.9 3.5 3.6  CL 103   < > 102 94* 97* 93* 92*  CO2 21*   < > 21* 27 26 28 28   GLUCOSE 151*   < > 131* 108* 100* 189* 209*  BUN 40*   < > 41* 29* 22 23 23   CREATININE 0.96   < > 0.87 0.99 0.81 0.93 0.86  CALCIUM 9.1   < > 9.1 8.8* 8.8* 8.7* 9.0  AST  --    < > 72* 49* 41 39 39  ALT  --    < > 104* 115* 84* 79* 78*  ALKPHOS  --    < > 69 72 68 76 71  BILITOT  --    < > 1.8* 0.9 0.7 0.8 0.9  ALBUMIN  --    < > 2.7* 2.8* 2.6* 2.5* 2.5*  MG  --    < >  2.5* 2.2 2.1 2.0 2.0  CRP  --    < > <0.5 <0.5 3.1* 2.3* 1.3*  DDIMER 12.07*   < > 3.82* 7.04* 2.93* 1.87* 1.48*  PROCALCITON  --   --   --   --   --  0.16 <0.10  TSH 0.183*  --   --   --   --   --   --   BNP  --    < > 37.4 74.2 50.0 45.0 44.3   < > = values in this interval not displayed.     No results found for this or any previous visit (from the past 240 hour(s)).    Radiology Studies: DG Chest Port 1 View  Result Date: 09/23/2020 CLINICAL DATA:  Shortness of breath.  COVID-19 virus infection. EXAM: PORTABLE CHEST 1 VIEW COMPARISON:  09/20/2020 FINDINGS: Heart size is stable. Persistent infiltrates are seen in both lung bases, with mild worsening in the left lower lobe since prior study. No definite pleural effusion or pneumothorax visualized. IMPRESSION: Bibasilar pulmonary infiltrates, with mild worsening in left lower lobe since prior study. Electronically Signed   By: 09/25/2020 M.D.   On: 09/23/2020 08:05       LOS: 14 days   Kayleanna Lorman  [facility] Pager on [email or web address]  09/24/2020, 9:25 AM

## 2020-09-25 DIAGNOSIS — U071 COVID-19: Secondary | ICD-10-CM | POA: Diagnosis not present

## 2020-09-25 DIAGNOSIS — J9601 Acute respiratory failure with hypoxia: Secondary | ICD-10-CM | POA: Diagnosis not present

## 2020-09-25 LAB — GLUCOSE, CAPILLARY
Glucose-Capillary: 156 mg/dL — ABNORMAL HIGH (ref 70–99)
Glucose-Capillary: 161 mg/dL — ABNORMAL HIGH (ref 70–99)
Glucose-Capillary: 171 mg/dL — ABNORMAL HIGH (ref 70–99)
Glucose-Capillary: 119 mg/dL — ABNORMAL HIGH (ref 70–99)

## 2020-09-25 LAB — COMPREHENSIVE METABOLIC PANEL
ALT: 86 U/L — ABNORMAL HIGH (ref 0–44)
AST: 45 U/L — ABNORMAL HIGH (ref 15–41)
Albumin: 2.6 g/dL — ABNORMAL LOW (ref 3.5–5.0)
Alkaline Phosphatase: 82 U/L (ref 38–126)
Anion gap: 11 (ref 5–15)
BUN: 25 mg/dL — ABNORMAL HIGH (ref 8–23)
CO2: 29 mmol/L (ref 22–32)
Calcium: 9.3 mg/dL (ref 8.9–10.3)
Chloride: 93 mmol/L — ABNORMAL LOW (ref 98–111)
Creatinine, Ser: 0.8 mg/dL (ref 0.44–1.00)
GFR, Estimated: >60 mL/min (ref >60)
Glucose, Bld: 168 mg/dL — ABNORMAL HIGH (ref 70–99)
Potassium: 3.7 mmol/L (ref 3.5–5.1)
Sodium: 133 mmol/L — ABNORMAL LOW (ref 135–145)
Total Bilirubin: 0.5 mg/dL (ref 0.3–1.2)
Total Protein: 6.2 g/dL — ABNORMAL LOW (ref 6.5–8.1)

## 2020-09-25 LAB — BRAIN NATRIURETIC PEPTIDE: B Natriuretic Peptide: 59.4 pg/mL (ref 0.0–100.0)

## 2020-09-25 LAB — CBC WITH DIFFERENTIAL/PLATELET
Abs Immature Granulocytes: 0.49 10*3/uL — ABNORMAL HIGH (ref 0.00–0.07)
Basophils Absolute: 0 10*3/uL (ref 0.0–0.1)
Basophils Relative: 0 %
Eosinophils Absolute: 0 10*3/uL (ref 0.0–0.5)
Eosinophils Relative: 0 %
HCT: 31.2 % — ABNORMAL LOW (ref 36.0–46.0)
Hemoglobin: 11.4 g/dL — ABNORMAL LOW (ref 12.0–15.0)
Immature Granulocytes: 2 %
Lymphocytes Relative: 6 %
Lymphs Abs: 1.2 10*3/uL (ref 0.7–4.0)
MCH: 27.9 pg (ref 26.0–34.0)
MCHC: 36.5 g/dL — ABNORMAL HIGH (ref 30.0–36.0)
MCV: 76.5 fL — ABNORMAL LOW (ref 80.0–100.0)
Monocytes Absolute: 0.6 10*3/uL (ref 0.1–1.0)
Monocytes Relative: 3 %
Neutro Abs: 18.4 10*3/uL — ABNORMAL HIGH (ref 1.7–7.7)
Neutrophils Relative %: 89 %
Platelets: 278 10*3/uL (ref 150–400)
RBC: 4.08 MIL/uL (ref 3.87–5.11)
RDW: 12.2 % (ref 11.5–15.5)
WBC: 20.7 10*3/uL — ABNORMAL HIGH (ref 4.0–10.5)
nRBC: 0 % (ref 0.0–0.2)

## 2020-09-25 LAB — PROCALCITONIN: Procalcitonin: <0.1 ng/mL

## 2020-09-25 LAB — MAGNESIUM: Magnesium: 2 mg/dL (ref 1.7–2.4)

## 2020-09-25 LAB — D-DIMER, QUANTITATIVE: D-Dimer, Quant: 1.11 ug/mL-FEU — ABNORMAL HIGH (ref 0.00–0.50)

## 2020-09-25 LAB — C-REACTIVE PROTEIN: CRP: 0.5 mg/dL (ref <1.0)

## 2020-09-25 MED ORDER — LACTATED RINGERS IV SOLN: INTRAVENOUS | Status: AC

## 2020-09-25 MED ORDER — POLYETHYLENE GLYCOL 3350 17 G PO PACK
17.0000 g | PACK | Freq: Two times a day (BID) | ORAL | Status: DC | PRN
  Administered 2020-10-03: 17 g via ORAL
  Filled 2020-09-25 (×2): qty 1

## 2020-09-25 NOTE — Progress Notes (Signed)
PROGRESS NOTE  Hannah Torres QXI:[id number] DOB: 05-26-1946 DOA: Sep 30, 2020  PCP: Pcp, No  Brief History/Interval Summary: 74 y.o. female with medical history significant of hypertension, former smoker presented to emergency department with worsening shortness of breath since 4 days.  She is not vaccinated against COVID-19.  Patient was noted to be hypoxic in the emergency department.  Chest x-ray showed bilateral opacities.  Patient was hospitalized for further management.  Reason for Visit: Pneumonia due to COVID-19.  Acute respiratory failure with hypoxia  Consultants: None  Procedures: None  Antibiotics: Anti-infectives (From admission, onward)   Start     Dose/Rate Route Frequency Ordered Stop   09/11/20 1000  remdesivir 100 mg in sodium chloride 0.9 % 100 mL IVPB       "Followed by" Linked Group Details   100 mg 200 mL/hr over 30 Minutes Intravenous Daily September 30, 2020 1815 09/14/20 1134   09-30-2020 2000  remdesivir 200 mg in sodium chloride 0.9% 250 mL IVPB       "Followed by" Linked Group Details   200 mg 580 mL/hr over 30 Minutes Intravenous Once 09/30/20 1815 09-30-2020 2228   30-Sep-2020 1530  cefTRIAXone (ROCEPHIN) 2 g in sodium chloride 0.9 % 100 mL IVPB  Status:  Discontinued        2 g 200 mL/hr over 30 Minutes Intravenous Every 24 hours 09-30-20 1517 09/11/20 0704   2020-09-30 1530  azithromycin (ZITHROMAX) 500 mg in sodium chloride 0.9 % 250 mL IVPB  Status:  Discontinued        500 mg 250 mL/hr over 60 Minutes Intravenous Every 24 hours 09/30/20 1517 09/11/20 0704      Subjective/Interval History:   Patient in bed, appears comfortable, denies any headache, no fever, no chest pain or pressure, improved shortness of breath , no abdominal pain. No focal weakness.    Assessment/Plan:  Acute Hypoxic Resp. Failure/Pneumonia due to COVID-19 + DVT and possible PE - She is unfortunately not vaccinated for Covid and has incurred severe parenchymal lung injury due to COVID-19  pneumonia, she has been treated with IV steroids, Remdesivir and Baricitinib. Lasix as needed being used as well, she was switched to HHFL on 09/22/20 with ++ improvement, still tenuous but much better than before.   Encouraged her to sit up in chair in daytime use I-S and flutter valve for pulmonary toiletry and prone at night if possible.  SpO2: 90 % O2 Flow Rate (L/min): 30 L/min FiO2 (%): 60 %    Recent Labs  Lab 09/21/20 0409 09/22/20 0516 09/23/20 0613 09/24/20 0318 09/25/20 0327 09/25/20 0328  WBC 13.9* 12.7* 15.8* 14.4* 20.7*  --   CRP <0.5 3.1* 2.3* 1.3* 0.5  --   DDIMER 7.04* 2.93* 1.87* 1.48* 1.11*  --   BNP 74.2 50.0 45.0 44.3  --  59.4  PROCALCITON  --   --  0.16 <0.10 <0.10  --   AST 49* 41 39 39 45*  --   ALT 115* 84* 79* 78* 86*  --   ALKPHOS 72 68 76 71 82  --   BILITOT 0.9 0.7 0.8 0.9 0.5  --   ALBUMIN 2.8* 2.6* 2.5* 2.5* 2.6*  --        Acute DVT bilateral lower extremities/presumed PE -  Patient found to have a bilateral lower extremity DVT.  Has been on full dose Lovenox will transition her to Eliquis on 09/24/2020 as D-dimer is now trending down, treatment will be 6 months total..  Atrial  fibrillation with RVR with [place] vas 2 score of greater than 3. No previous history of same.  Stable echocardiogram and TSH, was on Cardizem infusion, and 2 doses of IV digoxin, currently appears stable on high-dose oral beta-blocker which will be continued.  Already on full dose anticoagulation due to bilateral lower DVTs and presumed PE.  Transaminitis - Secondary to COVID-19 and possibly remdesivir use.  She is asymptomatic and has stable RUQ [place] - now stable trend .  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  Lab Results  Component Value Date   TSH 0.183 (L) 09/18/2020    Essential hypertension - B.Blocker.  Acute kidney injury - caused by ATN from infection, resolved after  hydration.  Thrombocytosis/leukocytosis - Likely reactive due to acute infection.  Elevated WBC likely due to steroids.  Platelet counts have improved.  Hyperglycemia - No known history of diabetes.  HbA1c 6.4.  Hyperglycemia likely due to steroids.  Continue low-dose Lantus and SSI.  CBG (last 3)  Recent Labs    09/24/20 1552 09/24/20 2007 09/25/20 0750  GLUCAP 125* 146* 156*      DVT Prophylaxis: On therapeutic Lovenox Code Status: Full code Family Communication: Daughter Armando Reichert [phone]  on 09/19/2020, 09/23/20 Disposition Plan: Hopefully return home when improved  Status is: Inpatient  Remains inpatient appropriate because:IV treatments appropriate due to intensity of illness or inability to take PO and Inpatient level of care appropriate due to severity of illness   Dispo:  Patient From: Home  Planned Disposition: Home  Expected discharge date: 09/29/20  Medically stable for discharge: No   Medications:  Scheduled: . apixaban  5 mg Oral BID  . vitamin C  500 mg Oral Daily  . famotidine  20 mg Oral Daily  . feeding supplement  237 mL Oral TID BM  . influenza vaccine adjuvanted  0.5 mL Intramuscular Tomorrow-1000  . insulin aspart  0-20 Units Subcutaneous TID WC  . insulin aspart  0-5 Units Subcutaneous QHS  . insulin glargine  8 Units Subcutaneous QHS  . methylPREDNISolone (SOLU-MEDROL) injection  40 mg Intravenous Q12H  . metoprolol tartrate  50 mg Oral BID  . mometasone-formoterol  2 puff Inhalation BID  . multivitamin with minerals  1 tablet Oral Daily  . zinc sulfate  220 mg Oral Daily   Continuous: . lactated ringers 125 mL/hr at 09/25/20 0806   CXK:[id number], chlorpheniramine-HYDROcodone, diltiazem, guaiFENesin-dextromethorphan, lip balm, loperamide, [DISCONTINUED] ondansetron **OR** ondansetron (ZOFRAN) IV, phenol, sodium chloride   Objective:  Vital Signs  Vitals:   09/25/20 0400 09/25/20 0635 09/25/20 0749 09/25/20 0838  BP: (!)  107/59 (!) 94/56 (!) 122/59   Pulse: 82     Resp: 17 (!) 24 20 (!) 22  Temp: 97.8 F (36.6 C)  (!) 97.5 F (36.4 C)   TempSrc: Oral  Axillary   SpO2: 100% 100% 100% 90%  Weight:      Height:       No intake or output data in the 24 hours ending 09/25/20 1147 Filed Weights   09-18-2020 2150 09-18-2020 2155 09/11/20 1649  Weight: 78.5 kg 79.4 kg 76.2 kg    Exam   Awake Alert, No new F.N deficits, Normal affect [place].AT,PERRAL Supple Neck,No JVD, No cervical lymphadenopathy appriciated.  Symmetrical Chest wall movement, Good air movement bilaterally, CTAB RRR,No Gallops, Rubs or new Murmurs, No Parasternal Heave +ve B.Sounds, Abd Soft, No tenderness, No organomegaly appriciated, No  rebound - guarding or rigidity. No Cyanosis, Clubbing or edema, No new Rash or bruise   Lab Results:  Data Reviewed: I have personally reviewed following labs and imaging studies  Recent Labs  Lab 09/21/20 0409 09/22/20 0516 09/23/20 0613 09/24/20 0318 09/25/20 0327  WBC 13.9* 12.7* 15.8* 14.4* 20.7*  HGB 14.0 12.8 12.1 11.9* 11.4*  HCT 38.5 35.2* 32.6* 33.1* 31.2*  PLT 274 263 254 269 278  MCV 77.5* 77.7* 76.7* 77.3* 76.5*  MCH 28.2 28.3 28.5 27.8 27.9  MCHC 36.4* 36.4* 37.1* 36.0 36.5*  RDW 12.2 12.1 12.1 12.1 12.2  LYMPHSABS 1.0 1.0 1.0 1.0 1.2  MONOABS 0.7 0.8 0.8 0.4 0.6  EOSABS 0.0 0.0 0.0 0.0 0.0  BASOSABS 0.0 0.0 0.0 0.0 0.0    Recent Labs  Lab 09/21/20 0409 09/22/20 0516 09/23/20 0613 09/24/20 0318 09/25/20 0327 09/25/20 0328  NA 134* 134* 134* 133* 133*  --   K 3.4* 3.9 3.5 3.6 3.7  --   CL 94* 97* 93* 92* 93*  --   CO2 27 26 28 28 29   --   GLUCOSE 108* 100* 189* 209* 168*  --   BUN 29* 22 23 23  25*  --   CREATININE 0.99 0.81 0.93 0.86 0.80  --   CALCIUM 8.8* 8.8* 8.7* 9.0 9.3  --   AST 49* 41 39 39 45*  --   ALT 115* 84* 79* 78* 86*  --   ALKPHOS 72 68 76 71 82  --   BILITOT 0.9 0.7 0.8 0.9 0.5  --   ALBUMIN 2.8* 2.6* 2.5* 2.5* 2.6*  --   MG 2.2 2.1 2.0 2.0 2.0  --    CRP <0.5 3.1* 2.3* 1.3* 0.5  --   DDIMER 7.04* 2.93* 1.87* 1.48* 1.11*  --   PROCALCITON  --   --  0.16 <0.10 <0.10  --   BNP 74.2 50.0 45.0 44.3  --  59.4     No results found for this or any previous visit (from the past 240 hour(s)).    Radiology Studies: No results found.     LOS: 15 days    [facility] Pager on [email or web address]  09/25/2020, 11:47 AM

## 2020-09-25 NOTE — Care Management (Signed)
Benefit check sent for Eliquis 

## 2020-09-26 ENCOUNTER — Inpatient Hospital Stay (HOSPITAL_COMMUNITY): Payer: Medicare PPO

## 2020-09-26 DIAGNOSIS — J9601 Acute respiratory failure with hypoxia: Secondary | ICD-10-CM | POA: Diagnosis not present

## 2020-09-26 DIAGNOSIS — U071 COVID-19: Secondary | ICD-10-CM | POA: Diagnosis not present

## 2020-09-26 LAB — GLUCOSE, CAPILLARY
Glucose-Capillary: 133 mg/dL — ABNORMAL HIGH (ref 70–99)
Glucose-Capillary: 140 mg/dL — ABNORMAL HIGH (ref 70–99)
Glucose-Capillary: 187 mg/dL — ABNORMAL HIGH (ref 70–99)
Glucose-Capillary: 148 mg/dL — ABNORMAL HIGH (ref 70–99)

## 2020-09-26 LAB — COMPREHENSIVE METABOLIC PANEL
ALT: 94 U/L — ABNORMAL HIGH (ref 0–44)
AST: 50 U/L — ABNORMAL HIGH (ref 15–41)
Albumin: 2.4 g/dL — ABNORMAL LOW (ref 3.5–5.0)
Alkaline Phosphatase: 80 U/L (ref 38–126)
Anion gap: 9 (ref 5–15)
BUN: 25 mg/dL — ABNORMAL HIGH (ref 8–23)
CO2: 29 mmol/L (ref 22–32)
Calcium: 8.9 mg/dL (ref 8.9–10.3)
Chloride: 96 mmol/L — ABNORMAL LOW (ref 98–111)
Creatinine, Ser: 0.75 mg/dL (ref 0.44–1.00)
GFR, Estimated: >60 mL/min (ref >60)
Glucose, Bld: 156 mg/dL — ABNORMAL HIGH (ref 70–99)
Potassium: 4.3 mmol/L (ref 3.5–5.1)
Sodium: 134 mmol/L — ABNORMAL LOW (ref 135–145)
Total Bilirubin: 0.5 mg/dL (ref 0.3–1.2)
Total Protein: 5.6 g/dL — ABNORMAL LOW (ref 6.5–8.1)

## 2020-09-26 LAB — C-REACTIVE PROTEIN: CRP: 0.5 mg/dL (ref <1.0)

## 2020-09-26 LAB — BRAIN NATRIURETIC PEPTIDE: B Natriuretic Peptide: 70.7 pg/mL (ref 0.0–100.0)

## 2020-09-26 LAB — CBC WITH DIFFERENTIAL/PLATELET
Abs Immature Granulocytes: 0.42 10*3/uL — ABNORMAL HIGH (ref 0.00–0.07)
Basophils Absolute: 0 10*3/uL (ref 0.0–0.1)
Basophils Relative: 0 %
Eosinophils Absolute: 0 10*3/uL (ref 0.0–0.5)
Eosinophils Relative: 0 %
HCT: 28.6 % — ABNORMAL LOW (ref 36.0–46.0)
Hemoglobin: 10.4 g/dL — ABNORMAL LOW (ref 12.0–15.0)
Immature Granulocytes: 2 %
Lymphocytes Relative: 5 %
Lymphs Abs: 0.9 10*3/uL (ref 0.7–4.0)
MCH: 28.2 pg (ref 26.0–34.0)
MCHC: 36.4 g/dL — ABNORMAL HIGH (ref 30.0–36.0)
MCV: 77.5 fL — ABNORMAL LOW (ref 80.0–100.0)
Monocytes Absolute: 0.6 10*3/uL (ref 0.1–1.0)
Monocytes Relative: 3 %
Neutro Abs: 18.3 10*3/uL — ABNORMAL HIGH (ref 1.7–7.7)
Neutrophils Relative %: 90 %
Platelets: 261 10*3/uL (ref 150–400)
RBC: 3.69 MIL/uL — ABNORMAL LOW (ref 3.87–5.11)
RDW: 12.3 % (ref 11.5–15.5)
WBC: 20.3 10*3/uL — ABNORMAL HIGH (ref 4.0–10.5)
nRBC: 0 % (ref 0.0–0.2)

## 2020-09-26 LAB — PROCALCITONIN: Procalcitonin: <0.1 ng/mL

## 2020-09-26 LAB — D-DIMER, QUANTITATIVE: D-Dimer, Quant: 1.17 ug/mL-FEU — ABNORMAL HIGH (ref 0.00–0.50)

## 2020-09-26 LAB — MAGNESIUM: Magnesium: 2 mg/dL (ref 1.7–2.4)

## 2020-09-26 NOTE — Progress Notes (Signed)
PROGRESS NOTE  Hannah Torres YNW:[id number] DOB: 03/28/1946 DOA: September 25, 2020  PCP: Pcp, No  Brief History/Interval Summary: 74 y.o. female with medical history significant of hypertension, former smoker presented to emergency department with worsening shortness of breath since 4 days.  She is not vaccinated against COVID-19.  Patient was noted to be hypoxic in the emergency department.  Chest x-ray showed bilateral opacities.  Patient was hospitalized for further management.  Reason for Visit: Pneumonia due to COVID-19.  Acute respiratory failure with hypoxia  Consultants: None  Procedures: None  Antibiotics: Anti-infectives (From admission, onward)   Start     Dose/Rate Route Frequency Ordered Stop   09/11/20 1000  remdesivir 100 mg in sodium chloride 0.9 % 100 mL IVPB       "Followed by" Linked Group Details   100 mg 200 mL/hr over 30 Minutes Intravenous Daily 09/25/2020 1815 09/14/20 1134   09-25-20 2000  remdesivir 200 mg in sodium chloride 0.9% 250 mL IVPB       "Followed by" Linked Group Details   200 mg 580 mL/hr over 30 Minutes Intravenous Once 09/25/2020 1815 September 25, 2020 2228   Sep 25, 2020 1530  cefTRIAXone (ROCEPHIN) 2 g in sodium chloride 0.9 % 100 mL IVPB  Status:  Discontinued        2 g 200 mL/hr over 30 Minutes Intravenous Every 24 hours 2020/09/25 1517 09/11/20 0704   September 25, 2020 1530  azithromycin (ZITHROMAX) 500 mg in sodium chloride 0.9 % 250 mL IVPB  Status:  Discontinued        500 mg 250 mL/hr over 60 Minutes Intravenous Every 24 hours 25-Sep-2020 1517 09/11/20 0704      Subjective/Interval History:   Patient in bed, appears comfortable, denies any headache, no fever, no chest pain or pressure, ++ shortness of breath , no abdominal pain. No focal weakness.   Assessment/Plan:  Acute Hypoxic Resp. Failure/Pneumonia due to COVID-19 + DVT and possible PE - She is unfortunately not vaccinated for Covid and has incurred severe parenchymal lung injury due to COVID-19 pneumonia,  she has been treated with IV steroids, Remdesivir and Baricitinib. Lasix as needed being used as well, she was switched to HHFL + NRB for breakthrough, still tenuous but much better than before.   Encouraged her to sit up in chair in daytime use I-S and flutter valve for pulmonary toiletry and prone at night if possible.  SpO2: 95 % O2 Flow Rate (L/min): 40 L/min FiO2 (%): 80 %    Recent Labs  Lab 09/22/20 0516 09/23/20 0613 09/24/20 0318 09/25/20 0327 09/25/20 0328 09/26/20 0250  WBC 12.7* 15.8* 14.4* 20.7*  --  20.3*  CRP 3.1* 2.3* 1.3* 0.5  --  0.5  DDIMER 2.93* 1.87* 1.48* 1.11*  --  1.17*  BNP 50.0 45.0 44.3  --  59.4 70.7  PROCALCITON  --  0.16 <0.10 <0.10  --  <0.10  AST 41 39 39 45*  --  50*  ALT 84* 79* 78* 86*  --  94*  ALKPHOS 68 76 71 82  --  80  BILITOT 0.7 0.8 0.9 0.5  --  0.5  ALBUMIN 2.6* 2.5* 2.5* 2.6*  --  2.4*       Acute DVT bilateral lower extremities/presumed PE -  Patient found to have a bilateral lower extremity DVT.  Has been on full dose Lovenox will transition her to Eliquis on 09/24/2020 as D-dimer is now trending down, treatment will be 6 months total..  Atrial fibrillation with RVR with  [place] vas 2 score of greater than 3. No previous history of same.  Stable echocardiogram and TSH, was on Cardizem infusion, and 2 doses of IV digoxin, currently appears stable on high-dose oral beta-blocker which will be continued.  Already on full dose anticoagulation due to bilateral lower DVTs and presumed PE.  Transaminitis - Secondary to COVID-19 and possibly remdesivir use.  She is asymptomatic and has stable RUQ [place] - now stable trend .  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  Lab Results  Component Value Date   TSH 0.183 (L) 09/18/2020    Essential hypertension - B.Blocker.  Acute kidney injury - caused by ATN from infection, resolved after  hydration.  Thrombocytosis/leukocytosis - Likely reactive due to acute infection.  Elevated WBC likely due to steroids.  Platelet counts have improved.  Hyperglycemia - No known history of diabetes.  HbA1c 6.4.  Hyperglycemia likely due to steroids.  Continue low-dose Lantus and SSI.  CBG (last 3)  Recent Labs    09/25/20 1624 09/25/20 2126 09/26/20 0746  GLUCAP 171* 119* 133*      DVT Prophylaxis: On therapeutic Lovenox Code Status: DNR Family Communication: Daughter Armando Reichert [phone]  on 09/19/2020, 09/23/20, 09/26/20 - explained to her tenuous condition - DNR Disposition Plan: Hopefully return home when improved  Status is: Inpatient  Remains inpatient appropriate because:IV treatments appropriate due to intensity of illness or inability to take PO and Inpatient level of care appropriate due to severity of illness   Dispo:  Patient From: Home  Planned Disposition: Home  Expected discharge date: 09/29/20  Medically stable for discharge: No   Medications:  Scheduled: . apixaban  5 mg Oral BID  . vitamin C  500 mg Oral Daily  . famotidine  20 mg Oral Daily  . feeding supplement  237 mL Oral TID BM  . influenza vaccine adjuvanted  0.5 mL Intramuscular Tomorrow-1000  . insulin aspart  0-20 Units Subcutaneous TID WC  . insulin aspart  0-5 Units Subcutaneous QHS  . insulin glargine  8 Units Subcutaneous QHS  . methylPREDNISolone (SOLU-MEDROL) injection  40 mg Intravenous Q12H  . metoprolol tartrate  50 mg Oral BID  . mometasone-formoterol  2 puff Inhalation BID  . multivitamin with minerals  1 tablet Oral Daily  . zinc sulfate  220 mg Oral Daily   Continuous:  LKG:[id number], chlorpheniramine-HYDROcodone, diltiazem, guaiFENesin-dextromethorphan, lip balm, loperamide, [DISCONTINUED] ondansetron **OR** ondansetron (ZOFRAN) IV, phenol, polyethylene glycol, sodium chloride   Objective:  Vital Signs  Vitals:   09/26/20 0230 09/26/20 0412 09/26/20 0744 09/26/20  0823  BP: 108/66 118/61 (!) 114/58   Pulse: 66 60 65   Resp: (!) 23 (!) 21 20   Temp:  98 F (36.7 C) 97.8 F (36.6 C)   TempSrc:  Oral Axillary   SpO2: 100% 100% 100% 95%  Weight:      Height:        Intake/Output Summary (Last 24 hours) at 09/26/2020 0920 Last data filed at 09/25/2020 1501 Gross per 24 hour  Intake 862.93 ml  Output --  Net 862.93 ml   Filed Weights   2020/09/30 2150 Sep 30, 2020 2155 09/11/20 1649  Weight: 78.5 kg 79.4 kg 76.2 kg    Exam   Awake Alert, No new F.N deficits, Normal affect [place].AT,PERRAL Supple Neck,No JVD, No cervical lymphadenopathy appriciated.  Symmetrical Chest wall movement, Good air movement bilaterally, CTAB RRR,No Gallops, Rubs or new  Murmurs, No Parasternal Heave +ve B.Sounds, Abd Soft, No tenderness, No organomegaly appriciated, No rebound - guarding or rigidity. No Cyanosis, Clubbing or edema, No new Rash or bruise    Lab Results:  Data Reviewed: I have personally reviewed following labs and imaging studies  Recent Labs  Lab 09/22/20 0516 09/23/20 0613 09/24/20 0318 09/25/20 0327 09/26/20 0250  WBC 12.7* 15.8* 14.4* 20.7* 20.3*  HGB 12.8 12.1 11.9* 11.4* 10.4*  HCT 35.2* 32.6* 33.1* 31.2* 28.6*  PLT 263 254 269 278 261  MCV 77.7* 76.7* 77.3* 76.5* 77.5*  MCH 28.3 28.5 27.8 27.9 28.2  MCHC 36.4* 37.1* 36.0 36.5* 36.4*  RDW 12.1 12.1 12.1 12.2 12.3  LYMPHSABS 1.0 1.0 1.0 1.2 0.9  MONOABS 0.8 0.8 0.4 0.6 0.6  EOSABS 0.0 0.0 0.0 0.0 0.0  BASOSABS 0.0 0.0 0.0 0.0 0.0    Recent Labs  Lab 09/22/20 0516 09/23/20 0613 09/24/20 0318 09/25/20 0327 09/25/20 0328 09/26/20 0250  NA 134* 134* 133* 133*  --  134*  K 3.9 3.5 3.6 3.7  --  4.3  CL 97* 93* 92* 93*  --  96*  CO2 26 28 28 29   --  29  GLUCOSE 100* 189* 209* 168*  --  156*  BUN 22 23 23  25*  --  25*  CREATININE 0.81 0.93 0.86 0.80  --  0.75  CALCIUM 8.8* 8.7* 9.0 9.3  --  8.9  AST 41 39 39 45*  --  50*  ALT 84* 79* 78* 86*  --  94*  ALKPHOS 68 76 71 82  --   80  BILITOT 0.7 0.8 0.9 0.5  --  0.5  ALBUMIN 2.6* 2.5* 2.5* 2.6*  --  2.4*  MG 2.1 2.0 2.0 2.0  --  2.0  CRP 3.1* 2.3* 1.3* 0.5  --  0.5  DDIMER 2.93* 1.87* 1.48* 1.11*  --  1.17*  PROCALCITON  --  0.16 <0.10 <0.10  --  <0.10  BNP 50.0 45.0 44.3  --  59.4 70.7     No results found for this or any previous visit (from the past 240 hour(s)).    Radiology Studies: No results found.    LOS: 16 days   Dayven Linsley  [facility] Pager on [email or web address]  09/26/2020, 9:20 AM

## 2020-09-27 DIAGNOSIS — J9601 Acute respiratory failure with hypoxia: Secondary | ICD-10-CM | POA: Diagnosis not present

## 2020-09-27 DIAGNOSIS — U071 COVID-19: Secondary | ICD-10-CM | POA: Diagnosis not present

## 2020-09-27 LAB — GLUCOSE, CAPILLARY
Glucose-Capillary: 116 mg/dL — ABNORMAL HIGH (ref 70–99)
Glucose-Capillary: 172 mg/dL — ABNORMAL HIGH (ref 70–99)
Glucose-Capillary: 188 mg/dL — ABNORMAL HIGH (ref 70–99)
Glucose-Capillary: 188 mg/dL — ABNORMAL HIGH (ref 70–99)

## 2020-09-27 LAB — COMPREHENSIVE METABOLIC PANEL
ALT: 81 U/L — ABNORMAL HIGH (ref 0–44)
AST: 31 U/L (ref 15–41)
Albumin: 2.5 g/dL — ABNORMAL LOW (ref 3.5–5.0)
Alkaline Phosphatase: 89 U/L (ref 38–126)
Anion gap: 12 (ref 5–15)
BUN: 27 mg/dL — ABNORMAL HIGH (ref 8–23)
CO2: 25 mmol/L (ref 22–32)
Calcium: 8.9 mg/dL (ref 8.9–10.3)
Chloride: 97 mmol/L — ABNORMAL LOW (ref 98–111)
Creatinine, Ser: 0.79 mg/dL (ref 0.44–1.00)
GFR, Estimated: >60 mL/min (ref >60)
Glucose, Bld: 160 mg/dL — ABNORMAL HIGH (ref 70–99)
Potassium: 4.6 mmol/L (ref 3.5–5.1)
Sodium: 134 mmol/L — ABNORMAL LOW (ref 135–145)
Total Bilirubin: 0.6 mg/dL (ref 0.3–1.2)
Total Protein: 5.6 g/dL — ABNORMAL LOW (ref 6.5–8.1)

## 2020-09-27 LAB — CBC WITH DIFFERENTIAL/PLATELET
Abs Immature Granulocytes: 0.42 10*3/uL — ABNORMAL HIGH (ref 0.00–0.07)
Basophils Absolute: 0 10*3/uL (ref 0.0–0.1)
Basophils Relative: 0 %
Eosinophils Absolute: 0.2 10*3/uL (ref 0.0–0.5)
Eosinophils Relative: 1 %
HCT: 28.9 % — ABNORMAL LOW (ref 36.0–46.0)
Hemoglobin: 10.4 g/dL — ABNORMAL LOW (ref 12.0–15.0)
Immature Granulocytes: 2 %
Lymphocytes Relative: 6 %
Lymphs Abs: 1.3 10*3/uL (ref 0.7–4.0)
MCH: 28.2 pg (ref 26.0–34.0)
MCHC: 36 g/dL (ref 30.0–36.0)
MCV: 78.3 fL — ABNORMAL LOW (ref 80.0–100.0)
Monocytes Absolute: 0.7 10*3/uL (ref 0.1–1.0)
Monocytes Relative: 3 %
Neutro Abs: 18.2 10*3/uL — ABNORMAL HIGH (ref 1.7–7.7)
Neutrophils Relative %: 88 %
Platelets: 224 10*3/uL (ref 150–400)
RBC: 3.69 MIL/uL — ABNORMAL LOW (ref 3.87–5.11)
RDW: 12.3 % (ref 11.5–15.5)
WBC: 20.8 10*3/uL — ABNORMAL HIGH (ref 4.0–10.5)
nRBC: 0 % (ref 0.0–0.2)

## 2020-09-27 LAB — MAGNESIUM: Magnesium: 1.9 mg/dL (ref 1.7–2.4)

## 2020-09-27 LAB — D-DIMER, QUANTITATIVE: D-Dimer, Quant: 1.62 ug/mL-FEU — ABNORMAL HIGH (ref 0.00–0.50)

## 2020-09-27 LAB — BRAIN NATRIURETIC PEPTIDE: B Natriuretic Peptide: 87.8 pg/mL (ref 0.0–100.0)

## 2020-09-27 LAB — C-REACTIVE PROTEIN: CRP: <0.5 mg/dL (ref <1.0)

## 2020-09-27 NOTE — Progress Notes (Signed)
No acute events overnight. Patient rested comfortably. Successfully able to wean o2 from 100% fio2 down to 85% fio2 at 35L.

## 2020-09-27 NOTE — Progress Notes (Signed)
PROGRESS NOTE  Hannah Torres DUK:[id number] DOB: 26-Feb-1946 DOA: 10/03/2020  PCP: Pcp, No  Brief History/Interval Summary: 74 y.o. female with medical history significant of hypertension, former smoker presented to emergency department with worsening shortness of breath since 4 days.  She is not vaccinated against COVID-19.  Patient was noted to be hypoxic in the emergency department.  Chest x-ray showed bilateral opacities.  Patient was hospitalized for further management.  Reason for Visit: Pneumonia due to COVID-19.  Acute respiratory failure with hypoxia  Consultants: None  Procedures: None  Antibiotics: Anti-infectives (From admission, onward)   Start     Dose/Rate Route Frequency Ordered Stop   09/11/20 1000  remdesivir 100 mg in sodium chloride 0.9 % 100 mL IVPB       "Followed by" Linked Group Details   100 mg 200 mL/hr over 30 Minutes Intravenous Daily 09/23/2020 1815 09/14/20 1134   10/01/2020 2000  remdesivir 200 mg in sodium chloride 0.9% 250 mL IVPB       "Followed by" Linked Group Details   200 mg 580 mL/hr over 30 Minutes Intravenous Once 10/07/2020 1815 09/23/2020 2228   09/26/2020 1530  cefTRIAXone (ROCEPHIN) 2 g in sodium chloride 0.9 % 100 mL IVPB  Status:  Discontinued        2 g 200 mL/hr over 30 Minutes Intravenous Every 24 hours 09/19/2020 1517 09/11/20 0704   09/29/2020 1530  azithromycin (ZITHROMAX) 500 mg in sodium chloride 0.9 % 250 mL IVPB  Status:  Discontinued        500 mg 250 mL/hr over 60 Minutes Intravenous Every 24 hours 09/25/2020 1517 09/11/20 0704      Subjective/Interval History:   Patient in bed, appears comfortable, denies any headache, no fever, no chest pain or pressure, +ve shortness of breath , no abdominal pain. No focal weakness.    Assessment/Plan:  Acute Hypoxic Resp. Failure/Pneumonia due to COVID-19 + DVT and possible PE - She is unfortunately not vaccinated for Covid and has incurred severe parenchymal lung injury due to COVID-19  pneumonia, she has been treated with IV steroids, Remdesivir and Baricitinib. Lasix as needed being used as well, she is still on HHFL + NRB , still tenuous and slow to improve.   Encouraged her to sit up in chair in daytime use I-S and flutter valve for pulmonary toiletry and prone at night if possible.  SpO2: 99 % O2 Flow Rate (L/min): 35 L/min FiO2 (%): 100 %    Recent Labs  Lab 09/23/20 0613 09/24/20 0318 09/25/20 0327 09/25/20 0328 09/26/20 0250 09/27/20 0411 09/27/20 0412  WBC 15.8* 14.4* 20.7*  --  20.3*  --  20.8*  CRP 2.3* 1.3* 0.5  --  0.5  --  <0.5  DDIMER 1.87* 1.48* 1.11*  --  1.17*  --  1.62*  BNP 45.0 44.3  --  59.4 70.7 87.8  --   PROCALCITON 0.16 <0.10 <0.10  --  <0.10  --   --   AST 39 39 45*  --  50*  --  31  ALT 79* 78* 86*  --  94*  --  81*  ALKPHOS 76 71 82  --  80  --  89  BILITOT 0.8 0.9 0.5  --  0.5  --  0.6  ALBUMIN 2.5* 2.5* 2.6*  --  2.4*  --  2.5*       Acute DVT bilateral lower extremities/presumed PE -  Patient found to have a bilateral lower extremity DVT.  Has been on full dose Lovenox will transition her to Eliquis on 09/24/2020 as D-dimer is now trending down, treatment will be 6 months total.  Atrial fibrillation with RVR with [place] vas 2 score of greater than 3. No previous history of same.  Stable echocardiogram and TSH, was on Cardizem infusion, and 2 doses of IV digoxin, currently appears stable on high-dose oral beta-blocker which will be continued.  Already on full dose anticoagulation due to bilateral lower DVTs and presumed PE.  Transaminitis - Secondary to COVID-19 and possibly remdesivir use.  She is asymptomatic and has stable RUQ [place] - now stable trend .  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  Lab Results  Component Value Date   TSH 0.183 (L) 09/18/2020    Essential hypertension - B.Blocker.  Acute kidney injury - caused by ATN from infection,  resolved after hydration.  Thrombocytosis/leukocytosis - Likely reactive due to acute infection.  Elevated WBC likely due to steroids.  Platelet counts have improved.  Hyperglycemia - No known history of diabetes.  HbA1c 6.4.  Hyperglycemia likely due to steroids.  Continue low-dose Lantus and SSI.  CBG (last 3)  Recent Labs    09/26/20 1602 09/26/20 2138 09/27/20 0716  GLUCAP 187* 148* 116*      DVT Prophylaxis: On therapeutic Lovenox Code Status: DNR Family Communication: Daughter Armando Reichert [phone]  on 09/19/2020, 09/23/20, 09/26/20 - explained to her tenuous condition - DNR Disposition Plan: Hopefully return home when improved  Status is: Inpatient  Remains inpatient appropriate because:IV treatments appropriate due to intensity of illness or inability to take PO and Inpatient level of care appropriate due to severity of illness   Dispo:  Patient From: Home  Planned Disposition: Home  Expected discharge date: 09/29/20  Medically stable for discharge: No   Medications:  Scheduled: . apixaban  5 mg Oral BID  . vitamin C  500 mg Oral Daily  . famotidine  20 mg Oral Daily  . feeding supplement  237 mL Oral TID BM  . influenza vaccine adjuvanted  0.5 mL Intramuscular Tomorrow-1000  . insulin aspart  0-20 Units Subcutaneous TID WC  . insulin aspart  0-5 Units Subcutaneous QHS  . insulin glargine  8 Units Subcutaneous QHS  . methylPREDNISolone (SOLU-MEDROL) injection  40 mg Intravenous Q12H  . metoprolol tartrate  50 mg Oral BID  . mometasone-formoterol  2 puff Inhalation BID  . multivitamin with minerals  1 tablet Oral Daily  . zinc sulfate  220 mg Oral Daily   Continuous:  AYT:[id number], chlorpheniramine-HYDROcodone, diltiazem, guaiFENesin-dextromethorphan, lip balm, loperamide, [DISCONTINUED] ondansetron **OR** ondansetron (ZOFRAN) IV, phenol, polyethylene glycol, sodium chloride   Objective:  Vital Signs  Vitals:   09/27/20 0003 09/27/20 0220 09/27/20  0412 09/27/20 0720  BP: (!) 106/57  106/63 (!) 115/56  Pulse: 78 83 78   Resp: 20 20 20  (!) 25  Temp: 97.8 F (36.6 C)  97.7 F (36.5 C) 97.8 F (36.6 C)  TempSrc: Oral  Oral Axillary  SpO2: 97%  100% 99%  Weight:      Height:        Intake/Output Summary (Last 24 hours) at 09/27/2020 0926 Last data filed at 09/26/2020 2000 Gross per 24 hour  Intake --  Output 800 ml  Net -800 ml   Filed Weights   2020-09-25 2150 September 25, 2020 2155 09/11/20 1649  Weight: 78.5 kg 79.4 kg 76.2 kg  Exam   Awake Alert, No new F.N deficits, Normal affect [place].AT,PERRAL Supple Neck,No JVD, No cervical lymphadenopathy appriciated.  Symmetrical Chest wall movement, Good air movement bilaterally, CTAB RRR,No Gallops, Rubs or new Murmurs, No Parasternal Heave +ve B.Sounds, Abd Soft, No tenderness, No organomegaly appriciated, No rebound - guarding or rigidity. No Cyanosis, Clubbing or edema, No new Rash or bruise   Lab Results:  Data Reviewed: I have personally reviewed following labs and imaging studies  Recent Labs  Lab 09/23/20 0613 09/24/20 0318 09/25/20 0327 09/26/20 0250 09/27/20 0412  WBC 15.8* 14.4* 20.7* 20.3* 20.8*  HGB 12.1 11.9* 11.4* 10.4* 10.4*  HCT 32.6* 33.1* 31.2* 28.6* 28.9*  PLT 254 269 278 261 224  MCV 76.7* 77.3* 76.5* 77.5* 78.3*  MCH 28.5 27.8 27.9 28.2 28.2  MCHC 37.1* 36.0 36.5* 36.4* 36.0  RDW 12.1 12.1 12.2 12.3 12.3  LYMPHSABS 1.0 1.0 1.2 0.9 1.3  MONOABS 0.8 0.4 0.6 0.6 0.7  EOSABS 0.0 0.0 0.0 0.0 0.2  BASOSABS 0.0 0.0 0.0 0.0 0.0    Recent Labs  Lab 09/23/20 0613 09/24/20 0318 09/25/20 0327 09/25/20 0328 09/26/20 0250 09/27/20 0411 09/27/20 0412  NA 134* 133* 133*  --  134*  --  134*  K 3.5 3.6 3.7  --  4.3  --  4.6  CL 93* 92* 93*  --  96*  --  97*  CO2 28 28 29   --  29  --  25  GLUCOSE 189* 209* 168*  --  156*  --  160*  BUN 23 23 25*  --  25*  --  27*  CREATININE 0.93 0.86 0.80  --  0.75  --  0.79  CALCIUM 8.7* 9.0 9.3  --  8.9  --  8.9   AST 39 39 45*  --  50*  --  31  ALT 79* 78* 86*  --  94*  --  81*  ALKPHOS 76 71 82  --  80  --  89  BILITOT 0.8 0.9 0.5  --  0.5  --  0.6  ALBUMIN 2.5* 2.5* 2.6*  --  2.4*  --  2.5*  MG 2.0 2.0 2.0  --  2.0  --  1.9  CRP 2.3* 1.3* 0.5  --  0.5  --  <0.5  DDIMER 1.87* 1.48* 1.11*  --  1.17*  --  1.62*  PROCALCITON 0.16 <0.10 <0.10  --  <0.10  --   --   BNP 45.0 44.3  --  59.4 70.7 87.8  --      No results found for this or any previous visit (from the past 240 hour(s)).    Radiology Studies: DG Chest Port 1 View  Result Date: 09/26/2020 CLINICAL DATA:  Shortness of breath, COVID 19 EXAM: PORTABLE CHEST 1 VIEW COMPARISON:  09/23/2020 FINDINGS: Multifocal patchy opacities in the lungs bilaterally, left lower lobe predominant, compatible with pneumonia in this patient with known COVID. This appearance is unchanged. No definite pleural effusions. No pneumothorax. The heart is normal in size. Degenerative changes of the thoracic spine. IMPRESSION: Multifocal pneumonia in this patient with known COVID, unchanged. Electronically Signed   By: 09/25/2020 M.D.   On: 09/26/2020 11:49      LOS: 17 days   Abanoub Hanken  [facility] Pager on [email or web address]  09/27/2020, 9:26 AM

## 2020-09-27 NOTE — TOC Benefit Eligibility Note (Signed)
Transition of Care [facility]) Benefit Eligibility Note    Patient Details  Name: Hannah Torres MRN: [id number] Date of Birth: 1946/11/03   Medication/Dose: Everlene Balls  5 MG BID  Covered?: Yes  Tier: 3 Drug  Prescription Coverage Preferred Pharmacy: [facility]  and  [facility]  Spoke with Person/Company/Phone Number:: TAKOYA    @ [employer] # [phone]  Co-Pay: $312.00  Prior Approval: No  Deductible: Unmet (OUT-OF-POCKET:UNMET)  Additional Notes: APIXABAN  and  ELIQUIS 10 MG : NON-FORMULARY    Mardene Sayer Phone Number: 09/27/2020, 10:09 AM

## 2020-09-28 ENCOUNTER — Other Ambulatory Visit: Payer: Self-pay

## 2020-09-28 DIAGNOSIS — J9601 Acute respiratory failure with hypoxia: Secondary | ICD-10-CM | POA: Diagnosis not present

## 2020-09-28 DIAGNOSIS — U071 COVID-19: Secondary | ICD-10-CM | POA: Diagnosis not present

## 2020-09-28 DIAGNOSIS — R748 Abnormal levels of other serum enzymes: Secondary | ICD-10-CM

## 2020-09-28 DIAGNOSIS — R7401 Elevation of levels of liver transaminase levels: Secondary | ICD-10-CM | POA: Diagnosis not present

## 2020-09-28 LAB — CBC WITH DIFFERENTIAL/PLATELET
Abs Immature Granulocytes: 0.28 10*3/uL — ABNORMAL HIGH (ref 0.00–0.07)
Basophils Absolute: 0 10*3/uL (ref 0.0–0.1)
Basophils Relative: 0 %
Eosinophils Absolute: 0.1 10*3/uL (ref 0.0–0.5)
Eosinophils Relative: 0 %
HCT: 31.2 % — ABNORMAL LOW (ref 36.0–46.0)
Hemoglobin: 11.1 g/dL — ABNORMAL LOW (ref 12.0–15.0)
Immature Granulocytes: 1 %
Lymphocytes Relative: 4 %
Lymphs Abs: 0.9 10*3/uL (ref 0.7–4.0)
MCH: 27.8 pg (ref 26.0–34.0)
MCHC: 35.6 g/dL (ref 30.0–36.0)
MCV: 78.2 fL — ABNORMAL LOW (ref 80.0–100.0)
Monocytes Absolute: 0.4 10*3/uL (ref 0.1–1.0)
Monocytes Relative: 2 %
Neutro Abs: 18.6 10*3/uL — ABNORMAL HIGH (ref 1.7–7.7)
Neutrophils Relative %: 93 %
Platelets: 226 10*3/uL (ref 150–400)
RBC: 3.99 MIL/uL (ref 3.87–5.11)
RDW: 12.5 % (ref 11.5–15.5)
WBC: 20.3 10*3/uL — ABNORMAL HIGH (ref 4.0–10.5)
nRBC: 0 % (ref 0.0–0.2)

## 2020-09-28 LAB — COMPREHENSIVE METABOLIC PANEL
ALT: 76 U/L — ABNORMAL HIGH (ref 0–44)
AST: 30 U/L (ref 15–41)
Albumin: 2.6 g/dL — ABNORMAL LOW (ref 3.5–5.0)
Alkaline Phosphatase: 92 U/L (ref 38–126)
Anion gap: 12 (ref 5–15)
BUN: 20 mg/dL (ref 8–23)
CO2: 26 mmol/L (ref 22–32)
Calcium: 9.3 mg/dL (ref 8.9–10.3)
Chloride: 97 mmol/L — ABNORMAL LOW (ref 98–111)
Creatinine, Ser: 0.74 mg/dL (ref 0.44–1.00)
GFR, Estimated: >60 mL/min (ref >60)
Glucose, Bld: 138 mg/dL — ABNORMAL HIGH (ref 70–99)
Potassium: 4.7 mmol/L (ref 3.5–5.1)
Sodium: 135 mmol/L (ref 135–145)
Total Bilirubin: 0.6 mg/dL (ref 0.3–1.2)
Total Protein: 6.1 g/dL — ABNORMAL LOW (ref 6.5–8.1)

## 2020-09-28 LAB — C-REACTIVE PROTEIN: CRP: 1 mg/dL — ABNORMAL HIGH (ref <1.0)

## 2020-09-28 LAB — D-DIMER, QUANTITATIVE: D-Dimer, Quant: 1.65 ug/mL-FEU — ABNORMAL HIGH (ref 0.00–0.50)

## 2020-09-28 LAB — GLUCOSE, CAPILLARY
Glucose-Capillary: 101 mg/dL — ABNORMAL HIGH (ref 70–99)
Glucose-Capillary: 142 mg/dL — ABNORMAL HIGH (ref 70–99)
Glucose-Capillary: 217 mg/dL — ABNORMAL HIGH (ref 70–99)
Glucose-Capillary: 157 mg/dL — ABNORMAL HIGH (ref 70–99)

## 2020-09-28 LAB — BRAIN NATRIURETIC PEPTIDE: B Natriuretic Peptide: 66 pg/mL (ref 0.0–100.0)

## 2020-09-28 LAB — MAGNESIUM: Magnesium: 2 mg/dL (ref 1.7–2.4)

## 2020-09-28 MED ORDER — MAGIC MOUTHWASH
5.0000 mL | Freq: Four times a day (QID) | ORAL | Status: DC
  Administered 2020-09-28 – 2020-10-04 (×21): 5 mL via ORAL
  Filled 2020-09-28 (×37): qty 5

## 2020-09-28 MED ORDER — PANTOPRAZOLE SODIUM 40 MG PO TBEC
40.0000 mg | DELAYED_RELEASE_TABLET | Freq: Every day | ORAL | Status: DC
  Administered 2020-09-28 – 2020-10-04 (×7): 40 mg via ORAL
  Filled 2020-09-28 (×8): qty 1

## 2020-09-28 MED ORDER — METHYLPREDNISOLONE SODIUM SUCC 40 MG IJ SOLR
40.0000 mg | Freq: Every day | INTRAMUSCULAR | Status: DC
  Administered 2020-09-29 – 2020-10-03 (×5): 40 mg via INTRAVENOUS
  Filled 2020-09-28 (×5): qty 1

## 2020-09-28 NOTE — Progress Notes (Signed)
Hannah Torres [id number] Admission Data: 09/28/2020 3:25 PM Attending Provider: Starleen Arms, MD  PCP:Pcp, No  Hannah Torres is a 74 y.o. female patient transferred from 2W alert  & orientated  X 3,  DNR, VSS - Blood pressure (!) 118/59, pulse 71, temperature 98.7 F (37.1 C), temperature source Oral, resp. rate (!) 24, height 5\' 5"  (1.651 m), weight 76.2 kg, SpO2 100 %., O2 No c/o shortness of breath, no c/o chest pain, no distress noted. Tele # [id number] placed and pt is currently running:normal sinus rhythm.  Pt orientation to unit, room and routine. Information packet given to patient/family. armband ID verified with patient, and in place. SR up x 2, fall risk assessment complete with patient and family verbalizing understanding of risks associated with falls. Pt verbalizes an understanding of how to use the call bell and to call for help before getting out of bed.  Skin, clean-dry- intact without evidence of bruising, or skin tears.   No evidence of skin break down noted on exam.  Will continue to monitor and assist as needed.  , RN 09/28/2020 3:25 PM

## 2020-09-28 NOTE — Progress Notes (Signed)
Pt unable to eat breakfast without assistance at this time partially d/t pt anxiety other d/t possible muscle stiffness as described by pt. Pt O2 sats decreased to 77-79% and non-rebreather replaced.Pt O2 sats increased to 94%. MD also made aware of pt with white thrush like coating in oral cavity and recommendation for oral tx if no contraindications.

## 2020-09-28 NOTE — Progress Notes (Signed)
Pt had large bm. Assisted with hygiene. Pt up in chair. Report called to 5w Denny Peon nurse.

## 2020-09-28 NOTE — Progress Notes (Signed)
Patient rested well throughout night on 35L and 90% Fio2. No complaints from patient at this time.

## 2020-09-28 NOTE — Progress Notes (Signed)
PROGRESS NOTE  Hannah Torres SWN:[id number] DOB: [birth year] DOA: September 13, 2020  PCP: Pcp, No  Brief History/Interval Summary: 74 y.o. female with medical history significant of hypertension, former smoker presented to emergency department with worsening shortness of breath since 4 days.  She is not vaccinated against COVID-19.  Patient was noted to be hypoxic in the emergency department.  Chest x-ray showed bilateral opacities.  Patient was hospitalized for further management.  Reason for Visit: Pneumonia due to COVID-19.  Acute respiratory failure with hypoxia  Consultants: None  Procedures: None  Antibiotics: Anti-infectives (From admission, onward)   Start     Dose/Rate Route Frequency Ordered Stop   09/11/20 1000  remdesivir 100 mg in sodium chloride 0.9 % 100 mL IVPB       "Followed by" Linked Group Details   100 mg 200 mL/hr over 30 Minutes Intravenous Daily 13-Sep-2020 1815 09/14/20 1134   Sep 13, 2020 2000  remdesivir 200 mg in sodium chloride 0.9% 250 mL IVPB       "Followed by" Linked Group Details   200 mg 580 mL/hr over 30 Minutes Intravenous Once 09/13/2020 1815 Sep 13, 2020 2228   13-Sep-2020 1530  cefTRIAXone (ROCEPHIN) 2 g in sodium chloride 0.9 % 100 mL IVPB  Status:  Discontinued        2 g 200 mL/hr over 30 Minutes Intravenous Every 24 hours 09-13-20 1517 09/11/20 0704   09/13/20 1530  azithromycin (ZITHROMAX) 500 mg in sodium chloride 0.9 % 250 mL IVPB  Status:  Discontinued        500 mg 250 mL/hr over 60 Minutes Intravenous Every 24 hours 09-13-20 1517 09/11/20 0704      Subjective/Interval History:     Appears comfortable, denies fever, chills, headache, she does report dyspnea, intermittent cough as well .   Assessment/Plan:  Acute Hypoxic Resp. Failure/Pneumonia due to COVID-19 + DVT and possible PE  - She is unfortunately not vaccinated for Covid and has incurred severe parenchymal lung injury due to COVID-19 pneumonia,  -she has been treated with IV steroids,    -She was treated with Remdesivir  - she was  Treated with  Baricitinib.  - Lasix as needed being used as well, she is still on HHFL + NRB , still tenuous and slow to improve.   Encouraged her to sit up in chair in daytime use I-S and flutter valve for pulmonary toiletry and prone at night if possible.  SpO2: 99 % O2 Flow Rate (L/min): 35 L/min FiO2 (%): (S) 80 %    Recent Labs  Lab 09/23/20 0613 09/23/20 9381 09/24/20 0318 09/25/20 0327 09/25/20 0328 09/26/20 0250 09/27/20 0411 09/27/20 0412 09/28/20 0056  WBC 15.8*   < > 14.4* 20.7*  --  20.3*  --  20.8* 20.3*  CRP 2.3*   < > 1.3* 0.5  --  0.5  --  <0.5 1.0*  DDIMER 1.87*   < > 1.48* 1.11*  --  1.17*  --  1.62* 1.65*  BNP 45.0   < > 44.3  --  59.4 70.7 87.8  --  66.0  PROCALCITON 0.16  --  <0.10 <0.10  --  <0.10  --   --   --   AST 39   < > 39 45*  --  50*  --  31 30  ALT 79*   < > 78* 86*  --  94*  --  81* 76*  ALKPHOS 76   < > 71 82  --  80  --  89 92  BILITOT 0.8   < > 0.9 0.5  --  0.5  --  0.6 0.6  ALBUMIN 2.5*   < > 2.5* 2.6*  --  2.4*  --  2.5* 2.6*   < > = values in this interval not displayed.       Acute DVT bilateral lower extremities/presumed PE  -  Patient found to have a bilateral lower extremity DVT.  Has been on full dose Lovenox will transition her to Eliquis on 09/24/2020 as D-dimer is now trending down, treatment will be 6 months total.  Atrial fibrillation with RVR with [place] vas 2 score of greater than 3. No previous history of same.  Stable echocardiogram and TSH, was on Cardizem infusion, and 2 doses of IV digoxin, currently appears stable on high-dose oral beta-blocker which will be continued.  Already on full dose anticoagulation due to bilateral lower DVTs and presumed PE.  Transaminitis - Secondary to COVID-19 and possibly remdesivir use.  She is asymptomatic and has stable RUQ [place] - now stable trend .  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  Lab Results  Component Value Date   TSH 0.183 (L) 09/18/2020    Essential hypertension - B.Blocker.  Acute kidney injury - caused by ATN from infection, resolved after hydration.  Thrombocytosis/leukocytosis - Likely reactive due to acute infection.  Elevated WBC likely due to steroids.  Platelet counts have improved.  Hyperglycemia - No known history of diabetes.  HbA1c 6.4.  Hyperglycemia likely due to steroids.  Continue low-dose Lantus and SSI.  CBG (last 3)  Recent Labs    09/27/20 1702 09/27/20 2043 09/28/20 0805  GLUCAP 188* 188* 101*    Oral thrush/candidiasis -Started on Magic mouthwash.  DVT Prophylaxis: On therapeutic Lovenox Code Status: DNR Family Communication: Daughter Armando Reichert [phone]  on 09/19/2020, 09/23/20, 09/26/20 - explained to her tenuous condition - DNR Disposition Plan: Hopefully return home when improved  Status is: Inpatient  Remains inpatient appropriate because:IV treatments appropriate due to intensity of illness or inability to take PO and Inpatient level of care appropriate due to severity of illness   Dispo:  Patient From: Home  Planned Disposition: Home  Expected discharge date: 10/09/2020  Medically stable for discharge: No   Medications:  Scheduled: . apixaban  5 mg Oral BID  . vitamin C  500 mg Oral Daily  . famotidine  20 mg Oral Daily  . feeding supplement  237 mL Oral TID BM  . influenza vaccine adjuvanted  0.5 mL Intramuscular Tomorrow-1000  . insulin aspart  0-20 Units Subcutaneous TID WC  . insulin aspart  0-5 Units Subcutaneous QHS  . insulin glargine  8 Units Subcutaneous QHS  . magic mouthwash  5 mL Oral QID  . methylPREDNISolone (SOLU-MEDROL) injection  40 mg Intravenous Q12H  . metoprolol tartrate  50 mg Oral BID  . mometasone-formoterol  2 puff Inhalation BID  . multivitamin with minerals  1 tablet Oral Daily  . zinc sulfate  220 mg Oral Daily   Continuous:  IZT:[id number],  chlorpheniramine-HYDROcodone, diltiazem, guaiFENesin-dextromethorphan, lip balm, loperamide, [DISCONTINUED] ondansetron **OR** ondansetron (ZOFRAN) IV, phenol, polyethylene glycol, sodium chloride   Objective:  Vital Signs  Vitals:   09/28/20 0135 09/28/20 0400 09/28/20 0852 09/28/20 1016  BP:  126/67  (!) 115/51  Pulse: 68 71  99  Resp: (!) 26 (!) 22    Temp:  98.4 F (36.9  C)    TempSrc:  Oral    SpO2: 98% 97% 99%   Weight:      Height:        Intake/Output Summary (Last 24 hours) at 09/28/2020 1137 Last data filed at 09/27/2020 1247 Gross per 24 hour  Intake --  Output 1000 ml  Net -1000 ml   Filed Weights   10/06/2020 2150 09/18/2020 2155 09/11/20 1649  Weight: 78.5 kg 79.4 kg 76.2 kg    Exam   Awake Alert, She has oral thrush Symmetrical Chest wall movement, Good air movement bilaterally, scattered rales RRR,No Gallops,Rubs or new Murmurs, No Parasternal Heave +ve B.Sounds, Abd Soft, No tenderness, No rebound - guarding or rigidity. No Cyanosis, Clubbing or edema, No new Rash or bruise      Lab Results:  Data Reviewed: I have personally reviewed following labs and imaging studies  Recent Labs  Lab 09/24/20 0318 09/25/20 0327 09/26/20 0250 09/27/20 0412 09/28/20 0056  WBC 14.4* 20.7* 20.3* 20.8* 20.3*  HGB 11.9* 11.4* 10.4* 10.4* 11.1*  HCT 33.1* 31.2* 28.6* 28.9* 31.2*  PLT 269 278 261 224 226  MCV 77.3* 76.5* 77.5* 78.3* 78.2*  MCH 27.8 27.9 28.2 28.2 27.8  MCHC 36.0 36.5* 36.4* 36.0 35.6  RDW 12.1 12.2 12.3 12.3 12.5  LYMPHSABS 1.0 1.2 0.9 1.3 0.9  MONOABS 0.4 0.6 0.6 0.7 0.4  EOSABS 0.0 0.0 0.0 0.2 0.1  BASOSABS 0.0 0.0 0.0 0.0 0.0    Recent Labs  Lab 09/23/20 0613 09/23/20 0175 09/24/20 0318 09/25/20 0327 09/25/20 0328 09/26/20 0250 09/27/20 0411 09/27/20 0412 09/28/20 0056  NA 134*   < > 133* 133*  --  134*  --  134* 135  K 3.5   < > 3.6 3.7  --  4.3  --  4.6 4.7  CL 93*   < > 92* 93*  --  96*  --  97* 97*  CO2 28   < > 28 29  --   29  --  25 26  GLUCOSE 189*   < > 209* 168*  --  156*  --  160* 138*  BUN 23   < > 23 25*  --  25*  --  27* 20  CREATININE 0.93   < > 0.86 0.80  --  0.75  --  0.79 0.74  CALCIUM 8.7*   < > 9.0 9.3  --  8.9  --  8.9 9.3  AST 39   < > 39 45*  --  50*  --  31 30  ALT 79*   < > 78* 86*  --  94*  --  81* 76*  ALKPHOS 76   < > 71 82  --  80  --  89 92  BILITOT 0.8   < > 0.9 0.5  --  0.5  --  0.6 0.6  ALBUMIN 2.5*   < > 2.5* 2.6*  --  2.4*  --  2.5* 2.6*  MG 2.0   < > 2.0 2.0  --  2.0  --  1.9 2.0  CRP 2.3*   < > 1.3* 0.5  --  0.5  --  <0.5 1.0*  DDIMER 1.87*   < > 1.48* 1.11*  --  1.17*  --  1.62* 1.65*  PROCALCITON 0.16  --  <0.10 <0.10  --  <0.10  --   --   --   BNP 45.0   < > 44.3  --  59.4 70.7 87.8  --  66.0   < > = values in this interval not displayed.     No results found for this or any previous visit (from the past 240 hour(s)).    Radiology Studies: No results found.    LOS: 18 days   Merchant navy officerDawood Jonathan Kirkendoll  [facility] Pager on [email or web address]  09/28/2020, 11:37 AM

## 2020-09-28 NOTE — Progress Notes (Signed)
Walked into pt room, O2 [place] removed by pt and mask sitting off centered on pt cheek. O2 sats sustaining at 97%.

## 2020-09-28 NOTE — Progress Notes (Signed)
Pt sats now decreased to 89%, o2 [place] placed back on pt. Increased to 90%.

## 2020-09-29 DIAGNOSIS — J9601 Acute respiratory failure with hypoxia: Secondary | ICD-10-CM | POA: Diagnosis not present

## 2020-09-29 DIAGNOSIS — U071 COVID-19: Secondary | ICD-10-CM | POA: Diagnosis not present

## 2020-09-29 LAB — GLUCOSE, CAPILLARY
Glucose-Capillary: 77 mg/dL (ref 70–99)
Glucose-Capillary: 104 mg/dL — ABNORMAL HIGH (ref 70–99)
Glucose-Capillary: 161 mg/dL — ABNORMAL HIGH (ref 70–99)
Glucose-Capillary: 156 mg/dL — ABNORMAL HIGH (ref 70–99)

## 2020-09-29 NOTE — Progress Notes (Signed)
Occupational Therapy Evaluation Patient Details Name: Hannah Torres MRN: [id number] DOB: Jul 08, 1946 Today's Date: 09/29/2020    History of Present Illness Pt is a 74 y.o. female admitted 09/15/2020 with worsening SOB. Workup for acute hypoxic respiratory failure secondary to COVID-19 PNA. Found to have BLE DVT and presumed PE. PMH includes HTN, tobacco use.   Clinical Impression   PTA pt lived with daughter and was independent with ADL, mobility, drove and enjoyed spending time with her 91 yo grandson. Pt on 15L HFNC and 15 L NRB however pt trying to eat and had removed NRB. SpO2 in the 90s on 15 L HFNC. Able to mobilize to recliner with 15L HFNC. Difficulty getting accurate pleth however SpO2 ranged from 85-95 with increased RR in the 30s/SOB with speaking. VC for pursed lip breathing. Hopeful for pt to progress to DC home with HHOT. Will follow acutely.     Follow Up Recommendations  Home health OT;Supervision/Assistance - 24 hour    Equipment Recommendations  3 in 1 bedside commode    Recommendations for Other Services       Precautions / Restrictions Precautions Precautions: Fall      Mobility Bed Mobility Overal bed mobility: Needs Assistance Bed Mobility: Supine to Sit     Supine to sit: Min guard          Transfers Overall transfer level: Needs assistance   Transfers: Sit to/from Stand;Stand Pivot Transfers Sit to Stand: Min guard Stand pivot transfers: Min assist       General transfer comment: Steady Assist    Balance Overall balance assessment: Needs assistance   Sitting balance-Leahy Scale: Good       Standing balance-Leahy Scale: Fair                             ADL either performed or assessed with clinical judgement   ADL Overall ADL's : Needs assistance/impaired Eating/Feeding: Set up   Grooming: Set up;Sitting   Upper Body Bathing: Set up;Sitting   Lower Body Bathing: Moderate assistance;Sit to/from stand   Upper Body  Dressing : Minimal assistance;Sitting   Lower Body Dressing: Moderate assistance;Sit to/from stand   Toilet Transfer: Minimal assistance;Stand-pivot   Toileting- Clothing Manipulation and Hygiene: Minimal assistance       Functional mobility during ADLs: Minimal assistance (limited mobility at this time to chair) General ADL Comments: easily fatigues     Vision Baseline Vision/History: Wears glasses Wears Glasses: Reading only       Perception     Praxis      Pertinent Vitals/Pain Pain Assessment: No/denies pain     Hand Dominance Right   Extremity/Trunk Assessment Upper Extremity Assessment Upper Extremity Assessment: Generalized weakness   Lower Extremity Assessment Lower Extremity Assessment: Defer to PT evaluation   Cervical / Trunk Assessment Cervical / Trunk Assessment: Normal   Communication Communication Communication: HOH   Cognition Arousal/Alertness: Awake/alert Behavior During Therapy: WFL for tasks assessed/performed Overall Cognitive Status: Impaired/Different from baseline Area of Impairment: Attention;Memory;Awareness                   Current Attention Level: Selective Memory: Decreased short-term memory     Awareness: Emergent   General Comments: Difficulty answering some home set up questions. Unsure if due to [facility] or slightly confused; pt saying "I feel like my rights have been taken away...my life has been turned upside down"; slow processing   General Comments  "i was  totally independent". Loves spending time with her 53 yo grandson Montez Morita; has 15 grandchildren    Exercises     Shoulder Instructions      Home Living Family/patient expects to be discharged to:: Private residence Living Arrangements: Children Available Help at Discharge: Family;Available 24 hours/day Type of Home: Apartment Home Access: Level entry     Home Layout: One level     Bathroom Shower/Tub: Chief Strategy Officer: Standard      Home Equipment: None          Prior Functioning/Environment Level of Independence: Independent                 OT Problem List: Decreased strength;Decreased activity tolerance;Impaired balance (sitting and/or standing);Decreased cognition;Decreased safety awareness;Decreased knowledge of use of DME or AE;Cardiopulmonary status limiting activity      OT Treatment/Interventions: Therapeutic exercise;Self-care/ADL training;Neuromuscular education;Energy conservation;DME and/or AE instruction;Therapeutic activities;Cognitive remediation/compensation;Patient/family education;Balance training    OT Goals(Current goals can be found in the care plan section) Acute Rehab OT Goals Patient Stated Goal: to get better adn see her grandson OT Goal Formulation: With patient Time For Goal Achievement: 10/13/20 Potential to Achieve Goals: Good  OT Frequency: Min 2X/week   Barriers to D/C:            Co-evaluation              AM-PAC OT "6 Clicks" Daily Activity     Outcome Measure Help from another person eating meals?: None Help from another person taking care of personal grooming?: A Little Help from another person toileting, which includes using toliet, bedpan, or urinal?: A Little Help from another person bathing (including washing, rinsing, drying)?: A Lot Help from another person to put on and taking off regular upper body clothing?: A Little Help from another person to put on and taking off regular lower body clothing?: A Lot 6 Click Score: 17   End of Session Equipment Utilized During Treatment: Oxygen (15L HFNC; 15L NRB at times) Nurse Communication: Mobility status  Activity Tolerance: Patient tolerated treatment well Patient left: in chair;with call bell/phone within reach  OT Visit Diagnosis: Unsteadiness on feet (R26.81);Muscle weakness (generalized) (M62.81);Other symptoms and signs involving cognitive function                Time: 1320-1355 OT Time  Calculation (min): 35 min Charges:  OT General Charges $OT Visit: 1 Visit OT Evaluation $OT Eval Moderate Complexity: 1 Mod OT Treatments $Self Care/Home Management : 8-22 mins  Luisa Dago, OT/L   Acute OT Clinical Specialist Acute Rehabilitation Services Pager [phone] Office [phone]   [facility] 09/29/2020, 2:02 PM

## 2020-09-29 NOTE — Progress Notes (Signed)
   09/29/20 1227  Family/Significant Other Communication  Family/Significant Other Update Called (spoked with duaghter crystal over the phone.)

## 2020-09-29 NOTE — Evaluation (Signed)
Physical Therapy Evaluation Patient Details Name: Hannah Torres MRN: [id number] DOB: 21-Jan-1946 Today's Date: 09/29/2020   History of Present Illness  Pt is a 74 y.o. female admitted 2020-09-27 with worsening SOB. Workup for acute hypoxic respiratory failure secondary to COVID-19 PNA. Found to have BLE DVT and presumed PE. PMH includes HTN, tobacco use.    Clinical Impression  Pt presents with an overall decrease in functional mobility secondary to above. PTA, pt independent, active, lives with daughter and enjoys spending time with multiple grandchildren. Initiated education re: current condition, O2 needs, activity recommendations, positioning, therex, energy conservation, and importance of mobility. Today, pt able to perform minimal bouts of seated and standing activity with SpO2 down to 79% on 15L O2 HFNC (unreliable pleth); pt requesting use of NRB due to significant SOB. SpO2 89-100% on 15L HFNC + 15L NRB. Pt limited by generalized weakness and decreased activity tolerance with associated cardiopulmonary impairments. Pt would benefit from continued acute PT services to maximize functional mobility and independence prior to d/c with HHPT services pending progression.    Follow Up Recommendations Home health PT;Supervision for mobility/OOB (pending progression)    Equipment Recommendations   (TBD)    Recommendations for Other Services       Precautions / Restrictions Precautions Precautions: Fall;Other (comment) Precaution Comments: Anxious regarding SOB - 15L HFNC + 15L NRB (supplemented with activity) Restrictions Weight Bearing Restrictions: No      Mobility  Bed Mobility               General bed mobility comments: Received sitting in recliner    Transfers Overall transfer level: Needs assistance Equipment used: None Transfers: Sit to/from Stand;Stand Pivot Transfers Sit to Stand: Min guard Stand pivot transfers: Min guard       General transfer comment: Able  to perform stand pivot transfer from recliner<>BSC with min guard for balance/lines; prolonged seated rest to recover with minimal activity  Ambulation/Gait             General Gait Details: Deferred secondary to fatigue and significant DOE with desaturation during stand pivot transfers  Stairs            Wheelchair Mobility    Modified Rankin (Stroke Patients Only)       Balance Overall balance assessment: Needs assistance   Sitting balance-Leahy Scale: Good       Standing balance-Leahy Scale: Fair Standing balance comment: Able to perform posterior pericare without UE support                             Pertinent Vitals/Pain Pain Assessment: No/denies pain    Home Living Family/patient expects to be discharged to:: Private residence Living Arrangements: Children Available Help at Discharge: Family;Available 24 hours/day Type of Home: Apartment Home Access: Level entry     Home Layout: One level Home Equipment: None      Prior Function Level of Independence: Independent         Comments: Enjoys spending time with grandchildren, enjoys shopping     Hand Dominance   Dominant Hand: Right    Extremity/Trunk Assessment   Upper Extremity Assessment Upper Extremity Assessment: Generalized weakness    Lower Extremity Assessment Lower Extremity Assessment: Generalized weakness       Communication   Communication: HOH  Cognition Arousal/Alertness: Awake/alert Behavior During Therapy: WFL for tasks assessed/performed Overall Cognitive Status: Impaired/Different from baseline Area of Impairment: Attention;Awareness  Current Attention Level: Selective       Awareness: Emergent   General Comments: Appears overwhelmed by current functional status/hospital admission and anxious due to SOB. Following commands and answering questions appropriately      General Comments General comments (skin integrity,  edema, etc.): RR up to 40s with activity. SpO2 down to 79% on 15L O2 HFNC (unsure if reliable pleth) and pt requesting NRB mask; with 15L HFNC + 15L NRB pt maintaining 89-100%    Exercises Other Exercises Other Exercises: Able to perform chair push up to reposition self with legs elevated in recliner (encouraged this for UE strengthening and pressure relief often) Other Exercises: Educ on seated LE therex, including LAQ, seated marching, ankle pumps, SLR   Assessment/Plan    PT Assessment Patient needs continued PT services  PT Problem List Decreased strength;Decreased activity tolerance;Decreased balance;Decreased mobility;Cardiopulmonary status limiting activity;Decreased knowledge of use of DME;Decreased knowledge of precautions       PT Treatment Interventions DME instruction;Gait training;Stair training;Functional mobility training;Therapeutic activities;Therapeutic exercise;Balance training;Patient/family education    PT Goals (Current goals can be found in the Care Plan section)  Acute Rehab PT Goals Patient Stated Goal: To get better and return home to see grandson PT Goal Formulation: With patient Time For Goal Achievement: 10/13/20 Potential to Achieve Goals: Fair    Frequency Min 3X/week   Barriers to discharge        Co-evaluation               AM-PAC PT "6 Clicks" Mobility  Outcome Measure Help needed turning from your back to your side while in a flat bed without using bedrails?: A Little Help needed moving from lying on your back to sitting on the side of a flat bed without using bedrails?: A Little Help needed moving to and from a bed to a chair (including a wheelchair)?: A Little Help needed standing up from a chair using your arms (e.g., wheelchair or bedside chair)?: A Little Help needed to walk in hospital room?: A Little Help needed climbing 3-5 steps with a railing? : A Lot 6 Click Score: 17    End of Session Equipment Utilized During Treatment:  Oxygen Activity Tolerance: Treatment limited secondary to medical complications (Comment);Patient limited by fatigue Patient left: in chair;with call bell/phone within reach Nurse Communication: Mobility status PT Visit Diagnosis: Other abnormalities of gait and mobility (R26.89)    Time: 9528-4132 PT Time Calculation (min) (ACUTE ONLY): 21 min   Charges:   PT Evaluation $PT Eval Moderate Complexity: 1 Mod     Ina Homes, PT, DPT Acute Rehabilitation Services  Pager [phone] Office [phone]  Malachy Chamber 09/29/2020, 5:15 PM

## 2020-09-29 NOTE — Progress Notes (Signed)
Nutrition Follow-up  DOCUMENTATION CODES:   Not applicable  INTERVENTION:  Continue Ensure Enlive po TID, each supplement provides 350 kcal and 20 grams of protein.  Continue Magic cup TID with meals, each supplement provides 290 kcal and 9 grams of protein  Encourage adequate PO intake.   NUTRITION DIAGNOSIS:   Increased nutrient needs related to catabolic illness ([id number]-58) as evidenced by estimated needs; ongoing  GOAL:   Patient will meet greater than or equal to 90% of their needs; met  MONITOR:   PO intake, Supplement acceptance, Weight trends, Labs, I & O's  REASON FOR ASSESSMENT:   LOS    ASSESSMENT:   Pt admitted with acute hypoxemic respiratory failure 2/2 COVID-19 PNA. Pt also with BLE DVT and presumed PE.  PMH includes HTN.  Pt is currently on 15 L HFNC. Meal completion has been 60-80%. Pt currently has Ensure ordered and has been consuming them. RD to continue with current nutritional supplementation to aid in caloric and protein needs.   Labs and medications reviewed.   Diet Order:   Diet Order            Diet 2 gram sodium Room service appropriate? Yes; Fluid consistency: Thin  Diet effective now                 EDUCATION NEEDS:   Not appropriate for education at this time  Skin:  Skin Assessment: Reviewed RN Assessment  Last BM:  10/19  Height:   Ht Readings from Last 1 Encounters:  09/11/20 '5\' 5"'  (1.651 m)    Weight:   Wt Readings from Last 1 Encounters:  09/11/20 76.2 kg   BMI:  Body mass index is 27.94 kg/m.  Estimated Nutritional Needs:   Kcal:  1900-2100  Protein:  95-105 grams  Fluid:  >/=1.9L/d  Corrin Parker, MS, RD, LDN RD pager number/after hours weekend pager number on [employer].

## 2020-09-29 NOTE — Progress Notes (Signed)
PROGRESS NOTE  LATASHA BUCZKOWSKI LGX:[id number] DOB: [birth year] DOA: 10/06/2020  PCP: Pcp, No  Brief History/Interval Summary:  - 74 y.o. female with medical history significant of hypertension, former smoker presented to emergency department with worsening shortness of breath since 4 days.  She is not vaccinated against COVID-19.  Patient was noted to be hypoxic in the emergency department.  Chest x-ray showed bilateral opacities.  Patient was hospitalized for further management.  Reason for Visit: Pneumonia due to COVID-19.  Acute respiratory failure with hypoxia  Consultants: None  Procedures: None  Antibiotics: Anti-infectives (From admission, onward)   Start     Dose/Rate Route Frequency Ordered Stop   09/11/20 1000  remdesivir 100 mg in sodium chloride 0.9 % 100 mL IVPB       "Followed by" Linked Group Details   100 mg 200 mL/hr over 30 Minutes Intravenous Daily 09/14/2020 1815 09/14/20 1134   10/02/2020 2000  remdesivir 200 mg in sodium chloride 0.9% 250 mL IVPB       "Followed by" Linked Group Details   200 mg 580 mL/hr over 30 Minutes Intravenous Once 10/02/2020 1815 10/02/2020 2228   09/14/2020 1530  cefTRIAXone (ROCEPHIN) 2 g in sodium chloride 0.9 % 100 mL IVPB  Status:  Discontinued        2 g 200 mL/hr over 30 Minutes Intravenous Every 24 hours 09/17/2020 1517 09/11/20 0704   10/07/2020 1530  azithromycin (ZITHROMAX) 500 mg in sodium chloride 0.9 % 250 mL IVPB  Status:  Discontinued        500 mg 250 mL/hr over 60 Minutes Intravenous Every 24 hours 09/11/2020 1517 09/11/20 0704      Subjective/Interval History:     No significant events overnight, she was transitioned from heated high flow to high flow nasal cannula.    Assessment/Plan:  Acute Hypoxic Resp. Failure/Pneumonia due to COVID-19 + DVT and possible PE  - She is unfortunately not vaccinated for Covid and has incurred severe parenchymal lung injury due to COVID-19 pneumonia,  -she has been treated with IV steroids,  she has been tapered gradually, she is currently on 40 mg of IV Solu-Medrol once daily. -She was treated with Remdesivir  - she was  Treated with  Baricitinib.  - Lasix as needed being used as well,  -She remains with significant oxygen requirement, she was transitioned from heated high flow to high flow nasal cannula with 15 L NRB as well.  .  Encouraged her to sit up in chair in daytime use I-S and flutter valve for pulmonary toiletry and prone at night if possible.  SpO2: 96 % O2 Flow Rate (L/min): 15 L/min FiO2 (%): 100 %    Recent Labs  Lab 09/23/20 0613 09/23/20 0613 09/24/20 0318 09/25/20 0327 09/25/20 0328 09/26/20 0250 09/27/20 0411 09/27/20 0412 09/28/20 0056  WBC 15.8*   < > 14.4* 20.7*  --  20.3*  --  20.8* 20.3*  CRP 2.3*   < > 1.3* 0.5  --  0.5  --  <0.5 1.0*  DDIMER 1.87*   < > 1.48* 1.11*  --  1.17*  --  1.62* 1.65*  BNP 45.0   < > 44.3  --  59.4 70.7 87.8  --  66.0  PROCALCITON 0.16  --  <0.10 <0.10  --  <0.10  --   --   --   AST 39   < > 39 45*  --  50*  --  31 30  ALT 79*   < >  78* 86*  --  94*  --  81* 76*  ALKPHOS 76   < > 71 82  --  80  --  89 92  BILITOT 0.8   < > 0.9 0.5  --  0.5  --  0.6 0.6  ALBUMIN 2.5*   < > 2.5* 2.6*  --  2.4*  --  2.5* 2.6*   < > = values in this interval not displayed.       Acute DVT bilateral lower extremities/presumed PE  -  Patient found to have a bilateral lower extremity DVT.  Has been on full dose Lovenox will transition her to Eliquis on 09/24/2020 as D-dimer is now trending down, treatment will be 6 months total.  Atrial fibrillation with RVR with [place] vas 2 score of greater than 3.  - No previous history of same.  Stable echocardiogram and TSH, was on Cardizem infusion, and 2 doses of IV digoxin, currently appears stable on high-dose oral beta-blocker which will be continued.  Already on full dose anticoagulation due to bilateral lower DVTs and presumed PE.  Transaminitis  - Secondary to COVID-19 and possibly  remdesivir use.  She is asymptomatic and has stable RUQ US - now stable trend .  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  Lab Results  Component Value Date   TSH 0.183 (L) 09/18/2020    Essential hypertension  - B.Blocker.  Acute kidney injury - caused by ATN from infection, resolved after hydration.  Thrombocytosis/leukocytosis - Likely reactive due to acute infection.  Elevated WBC likely due to steroids.  Platelet counts have improved.  Hyperglycemia - No known history of diabetes.  HbA1c 6.4.  Hyperglycemia likely due to steroids.  Continue low-dose Lantus and SSI.  CBG (last 3)  Recent Labs    09/28/20 2114 09/29/20 0746 09/29/20 1138  GLUCAP 157* 77 104*    Oral thrush/candidiasis -Started on Magic mouthwash.  DVT Prophylaxis: On therapeutic Lovenox Code Status: DNR Family Communication: Daughter Armando ReichertMarian [phone]  on 09/29/2020. Disposition Plan: Hopefully return home when improved  Status is: Inpatient  Remains inpatient appropriate because:IV treatments appropriate due to intensity of illness or inability to take PO and Inpatient level of care appropriate due to severity of illness   Dispo:  Patient From:    Planned Disposition: Home  Expected discharge date: 10/09/2020  Medically stable for discharge:     Medications:  Scheduled: . apixaban  5 mg Oral BID  . vitamin C  500 mg Oral Daily  . famotidine  20 mg Oral Daily  . feeding supplement  237 mL Oral TID BM  . influenza vaccine adjuvanted  0.5 mL Intramuscular Tomorrow-1000  . insulin aspart  0-20 Units Subcutaneous TID WC  . insulin aspart  0-5 Units Subcutaneous QHS  . insulin glargine  8 Units Subcutaneous QHS  . magic mouthwash  5 mL Oral QID  . methylPREDNISolone (SOLU-MEDROL) injection  40 mg Intravenous Daily  . metoprolol tartrate  50 mg Oral BID  . mometasone-formoterol  2 puff Inhalation BID  . multivitamin  with minerals  1 tablet Oral Daily  . pantoprazole  40 mg Oral Daily  . zinc sulfate  220 mg Oral Daily   Continuous:  UJW:[id number]:acetaminophen, chlorpheniramine-HYDROcodone, diltiazem, guaiFENesin-dextromethorphan, lip balm, loperamide, [DISCONTINUED] ondansetron **OR** ondansetron (ZOFRAN) IV, phenol, polyethylene glycol, sodium chloride   Objective:  Vital Signs  Vitals:   09/29/20 0805 09/29/20 [id number]  09/29/20 0911 09/29/20 1135  BP: (!) 121/56   121/62  Pulse: 90  85 78  Resp: 20  16 18   Temp: 99.5 F (37.5 C)   99.2 F (37.3 C)  TempSrc:    Oral  SpO2: 94% 92% 94% 96%  Weight:      Height:       No intake or output data in the 24 hours ending 09/29/20 1200 Filed Weights   2020/09/12 2150 September 12, 2020 2155 09/11/20 1649  Weight: 78.5 kg 79.4 kg 76.2 kg    Exam   Awake Alert, frail, deconditioned  symmetrical Chest wall movement, scattered rales RRR,No Gallops,Rubs or new Murmurs, No Parasternal Heave +ve B.Sounds, Abd Soft, No tenderness, No rebound - guarding or rigidity. No Cyanosis, Clubbing or edema, No new Rash or bruise       Lab Results:  Data Reviewed: I have personally reviewed following labs and imaging studies  Recent Labs  Lab 09/24/20 0318 09/25/20 0327 09/26/20 0250 09/27/20 0412 09/28/20 0056  WBC 14.4* 20.7* 20.3* 20.8* 20.3*  HGB 11.9* 11.4* 10.4* 10.4* 11.1*  HCT 33.1* 31.2* 28.6* 28.9* 31.2*  PLT 269 278 261 224 226  MCV 77.3* 76.5* 77.5* 78.3* 78.2*  MCH 27.8 27.9 28.2 28.2 27.8  MCHC 36.0 36.5* 36.4* 36.0 35.6  RDW 12.1 12.2 12.3 12.3 12.5  LYMPHSABS 1.0 1.2 0.9 1.3 0.9  MONOABS 0.4 0.6 0.6 0.7 0.4  EOSABS 0.0 0.0 0.0 0.2 0.1  BASOSABS 0.0 0.0 0.0 0.0 0.0    Recent Labs  Lab 09/23/20 0613 09/23/20 09/25/20 09/24/20 0318 09/25/20 0327 09/25/20 0328 09/26/20 0250 09/27/20 0411 09/27/20 0412 09/28/20 0056  NA 134*   < > 133* 133*  --  134*  --  134* 135  K 3.5   < > 3.6 3.7  --  4.3  --  4.6 4.7  CL 93*   < > 92* 93*  --  96*  --   97* 97*  CO2 28   < > 28 29  --  29  --  25 26  GLUCOSE 189*   < > 209* 168*  --  156*  --  160* 138*  BUN 23   < > 23 25*  --  25*  --  27* 20  CREATININE 0.93   < > 0.86 0.80  --  0.75  --  0.79 0.74  CALCIUM 8.7*   < > 9.0 9.3  --  8.9  --  8.9 9.3  AST 39   < > 39 45*  --  50*  --  31 30  ALT 79*   < > 78* 86*  --  94*  --  81* 76*  ALKPHOS 76   < > 71 82  --  80  --  89 92  BILITOT 0.8   < > 0.9 0.5  --  0.5  --  0.6 0.6  ALBUMIN 2.5*   < > 2.5* 2.6*  --  2.4*  --  2.5* 2.6*  MG 2.0   < > 2.0 2.0  --  2.0  --  1.9 2.0  CRP 2.3*   < > 1.3* 0.5  --  0.5  --  <0.5 1.0*  DDIMER 1.87*   < > 1.48* 1.11*  --  1.17*  --  1.62* 1.65*  PROCALCITON 0.16  --  <0.10 <0.10  --  <0.10  --   --   --   BNP 45.0   < > 44.3  --  59.4 70.7 87.8  --  66.0   < > = values in this interval not displayed.     No results found for this or any previous visit (from the past 240 hour(s)).    Radiology Studies: No results found.    LOS: 19 days   Merchant [employer] officer on [email or web address]  09/29/2020, 12:00 PM

## 2020-09-30 ENCOUNTER — Inpatient Hospital Stay (HOSPITAL_COMMUNITY): Payer: Medicare PPO

## 2020-09-30 DIAGNOSIS — J9601 Acute respiratory failure with hypoxia: Secondary | ICD-10-CM | POA: Diagnosis not present

## 2020-09-30 DIAGNOSIS — U071 COVID-19: Secondary | ICD-10-CM | POA: Diagnosis not present

## 2020-09-30 LAB — COMPREHENSIVE METABOLIC PANEL
ALT: 60 U/L — ABNORMAL HIGH (ref 0–44)
AST: 28 U/L (ref 15–41)
Albumin: 2.7 g/dL — ABNORMAL LOW (ref 3.5–5.0)
Alkaline Phosphatase: 98 U/L (ref 38–126)
Anion gap: 11 (ref 5–15)
BUN: 19 mg/dL (ref 8–23)
CO2: 25 mmol/L (ref 22–32)
Calcium: 9 mg/dL (ref 8.9–10.3)
Chloride: 100 mmol/L (ref 98–111)
Creatinine, Ser: 0.69 mg/dL (ref 0.44–1.00)
GFR, Estimated: >60 mL/min (ref >60)
Glucose, Bld: 97 mg/dL (ref 70–99)
Potassium: 4.1 mmol/L (ref 3.5–5.1)
Sodium: 136 mmol/L (ref 135–145)
Total Bilirubin: 0.6 mg/dL (ref 0.3–1.2)
Total Protein: 6.2 g/dL — ABNORMAL LOW (ref 6.5–8.1)

## 2020-09-30 LAB — CBC
HCT: 32.5 % — ABNORMAL LOW (ref 36.0–46.0)
Hemoglobin: 11.5 g/dL — ABNORMAL LOW (ref 12.0–15.0)
MCH: 28.2 pg (ref 26.0–34.0)
MCHC: 35.4 g/dL (ref 30.0–36.0)
MCV: 79.7 fL — ABNORMAL LOW (ref 80.0–100.0)
Platelets: 188 10*3/uL (ref 150–400)
RBC: 4.08 MIL/uL (ref 3.87–5.11)
RDW: 13.1 % (ref 11.5–15.5)
WBC: 19.3 10*3/uL — ABNORMAL HIGH (ref 4.0–10.5)
nRBC: 0 % (ref 0.0–0.2)

## 2020-09-30 LAB — GLUCOSE, CAPILLARY
Glucose-Capillary: 108 mg/dL — ABNORMAL HIGH (ref 70–99)
Glucose-Capillary: 156 mg/dL — ABNORMAL HIGH (ref 70–99)
Glucose-Capillary: 157 mg/dL — ABNORMAL HIGH (ref 70–99)
Glucose-Capillary: 99 mg/dL (ref 70–99)

## 2020-09-30 MED ORDER — MORPHINE SULFATE (PF) 2 MG/ML IV SOLN
1.0000 mg | INTRAVENOUS | Status: DC | PRN
  Administered 2020-09-30 – 2020-10-05 (×8): 1 mg via INTRAVENOUS
  Filled 2020-09-30 (×8): qty 1

## 2020-09-30 NOTE — Progress Notes (Signed)
PT Cancellation Note  Patient Details Name: Hannah Torres MRN: [id number] DOB: 10/11/1946   Cancelled Treatment:    Reason Eval/Treat Not Completed: Other (comment) Pt with rapid response called this morning.  Spoke with nursing staff who report pt has not fully recovered and not appropriate for PT today.  Will f/u as able. Anise Salvo, PT Acute Rehab Services Pager [phone] [facility] Re[facility] [phone]    Rayetta Humphrey 09/30/2020, 11:41 AM

## 2020-09-30 NOTE — TOC Transition Note (Signed)
Transition of Care [facility]) - CM/SW Discharge Note   Patient Details  Name: Hannah Torres MRN: [id number] Date of Birth: May 19, 1946  Transition of Care [facility]) CM/SW Contact:  Lockie Pares, RN Phone Number: 09/30/2020, 4:05 PM   Clinical Narrative:     Patient is transitioning to comfort measures.continues to decompensate on 60L of HFNC, 90% oxygen.  No needs identified with CM. Family members visiting.         Patient Goals and CMS Choice        Discharge Placement                       Discharge Plan and Services                                     Social Determinants of Health (SDOH) Interventions     Readmission Risk Interventions No flowsheet data found.

## 2020-09-30 NOTE — Progress Notes (Signed)
RT NOTES: Patient was moved to bedside commode, sats dropped to the 60s. Placed patient back on HHFNC 60L/100% with NRB mask on top. Sats slowly climbing up. Rapid response nurse in room.

## 2020-09-30 NOTE — Progress Notes (Signed)
PROGRESS NOTE  Hannah Torres KGU:[id number] DOB: [birth year] DOA: 10-02-2020  PCP: Pcp, No  Brief History/Interval Summary:   - 74 y.o. female with medical history significant of hypertension, former smoker presented to emergency department with worsening shortness of breath since 4 days.  She is not vaccinated against COVID-19.  Patient was noted to be hypoxic in the emergency department.  Chest x-ray showed bilateral opacities.  Patient was hospitalized for further management.  Reason for Visit: Pneumonia due to COVID-19.  Acute respiratory failure with hypoxia  Consultants: None  Procedures: None  Antibiotics: Anti-infectives (From admission, onward)   Start     Dose/Rate Route Frequency Ordered Stop   09/11/20 1000  remdesivir 100 mg in sodium chloride 0.9 % 100 mL IVPB       "Followed by" Linked Group Details   100 mg 200 mL/hr over 30 Minutes Intravenous Daily 10/02/2020 1815 09/14/20 1134   October 02, 2020 2000  remdesivir 200 mg in sodium chloride 0.9% 250 mL IVPB       "Followed by" Linked Group Details   200 mg 580 mL/hr over 30 Minutes Intravenous Once 2020-10-02 1815 2020-10-02 2228   Oct 02, 2020 1530  cefTRIAXone (ROCEPHIN) 2 g in sodium chloride 0.9 % 100 mL IVPB  Status:  Discontinued        2 g 200 mL/hr over 30 Minutes Intravenous Every 24 hours 10/02/2020 1517 09/11/20 0704   2020/10/02 1530  azithromycin (ZITHROMAX) 500 mg in sodium chloride 0.9 % 250 mL IVPB  Status:  Discontinued        500 mg 250 mL/hr over 60 Minutes Intravenous Every 24 hours 2020/10/02 1517 09/11/20 0704      Subjective/Interval History:     She is with significant dyspnea this morning when she went out of bed to commode, with significant increase of oxygen requirement .  Assessment/Plan:  Acute Hypoxic Resp. Failure/Pneumonia due to COVID-19 + DVT and possible PE  - She is unfortunately not vaccinated for Covid and has incurred severe parenchymal lung injury due to COVID-19 pneumonia,  -she has been  treated with IV steroids, she has been tapered gradually, she is currently on 40 mg of IV Solu-Medrol once daily. -She was treated with Remdesivir  - she was  Treated with  Baricitinib.  - Lasix as needed being used as well,  -Patient unfortunately with significant oxygen requirement, she is currently on 70 L high flow nasal cannula, and 15 L NRB, please see discussion below under goals of care , her chest x-ray showing worsening COVID-19 positive test (U07.1, COVID-19) with Acute Pneumonia (J12.89, Other viral pneumonia) (If respiratory failure or sepsis present, add as separate assessment)   Encouraged her to sit up in chair in daytime use I-S and flutter valve for pulmonary toiletry and prone at night if possible.  SpO2: (!) 87 % O2 Flow Rate (L/min): 70 L/min FiO2 (%): 100 %    Recent Labs  Lab 09/24/20 0318 09/24/20 0318 09/25/20 0327 09/25/20 0328 09/26/20 0250 09/27/20 0411 09/27/20 0412 09/28/20 0056 09/30/20 0420  WBC 14.4*   < > 20.7*  --  20.3*  --  20.8* 20.3* 19.3*  CRP 1.3*  --  0.5  --  0.5  --  <0.5 1.0*  --   DDIMER 1.48*  --  1.11*  --  1.17*  --  1.62* 1.65*  --   BNP 44.3  --   --  59.4 70.7 87.8  --  66.0  --   PROCALCITON <0.10  --  <  0.10  --  <0.10  --   --   --   --   AST 39   < > 45*  --  50*  --  31 30 28   ALT 78*   < > 86*  --  94*  --  81* 76* 60*  ALKPHOS 71   < > 82  --  80  --  89 92 98  BILITOT 0.9   < > 0.5  --  0.5  --  0.6 0.6 0.6  ALBUMIN 2.5*   < > 2.6*  --  2.4*  --  2.5* 2.6* 2.7*   < > = values in this interval not displayed.       Acute DVT bilateral lower extremities/presumed PE  -  Patient found to have a bilateral lower extremity DVT.  Has been on full dose Lovenox will transition her to Eliquis on 09/24/2020 as D-dimer is now trending down, treatment will be 6 months total.  Atrial fibrillation with RVR with 09/26/2020 vas 2 score of greater than 3.  - No previous history of same.  Stable echocardiogram and TSH, was on Cardizem  infusion, and 2 doses of IV digoxin, currently appears stable on high-dose oral beta-blocker which will be continued.  Already on full dose anticoagulation due to bilateral lower DVTs and presumed PE.  Transaminitis  - Secondary to COVID-19 and possibly remdesivir use.  She is asymptomatic and has stable RUQ [place] - now stable trend .  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  Lab Results  Component Value Date   TSH 0.183 (L) 09/18/2020    Essential hypertension  - B.Blocker.  Acute kidney injury - caused by ATN from infection, resolved after hydration.  Thrombocytosis/leukocytosis - Likely reactive due to acute infection.  Elevated WBC likely due to steroids.  Platelet counts have improved.  Hyperglycemia - No known history of diabetes.  HbA1c 6.4.  Hyperglycemia likely due to steroids.  Continue low-dose Lantus and SSI.  CBG (last 3)  Recent Labs    09/29/20 1956 09/30/20 0731 09/30/20 1141  GLUCAP 156* 108* 156*    Oral thrush/candidiasis -Started on Magic mouthwash.  Goals of care: -Unfortunately patient is with severe COVID-19 for pneumonia, with no improvement, she is currently getting decompensated, with significant oxygen requirement at this point 70 L heated high flow 100% on top of 15 L NRB, even with that her saturation on the lower side, she is quite symptomatic and dyspneic even sitting at rest with no movement, I have discussed with patient, and daughter, overall extremely poor prognosis, patient is confirmed to be DNR, at this point plan is to proceed with comfort measures after her family allowed to visit her, as she is having extended family with multiple family members to visit, we will keep her on current measures and oxygen supply for transitioning to comfort measure.  DVT Prophylaxis: On therapeutic Lovenox Code Status: DNR Family Communication: Daughter 10/02/20 [phone] by phone,  and at bedside today Disposition Plan: Hopefully return home when improved  Status is: Inpatient  Remains inpatient appropriate because:IV treatments appropriate due to intensity of illness or inability to take PO and Inpatient level of care appropriate due to severity of illness   Dispo:  Patient From:    Planned Disposition: Home  Expected discharge date: 10/09/2020  Medically stable for discharge:     Medications:  Scheduled: . apixaban  5  mg Oral BID  . vitamin C  500 mg Oral Daily  . famotidine  20 mg Oral Daily  . feeding supplement  237 mL Oral TID BM  . influenza vaccine adjuvanted  0.5 mL Intramuscular Tomorrow-1000  . insulin aspart  0-20 Units Subcutaneous TID WC  . insulin aspart  0-5 Units Subcutaneous QHS  . insulin glargine  8 Units Subcutaneous QHS  . magic mouthwash  5 mL Oral QID  . methylPREDNISolone (SOLU-MEDROL) injection  40 mg Intravenous Daily  . metoprolol tartrate  50 mg Oral BID  . mometasone-formoterol  2 puff Inhalation BID  . multivitamin with minerals  1 tablet Oral Daily  . pantoprazole  40 mg Oral Daily  . zinc sulfate  220 mg Oral Daily   Continuous:  ZMO:[id number], chlorpheniramine-HYDROcodone, diltiazem, guaiFENesin-dextromethorphan, lip balm, loperamide, [DISCONTINUED] ondansetron **OR** ondansetron (ZOFRAN) IV, phenol, polyethylene glycol, sodium chloride   Objective:  Vital Signs  Vitals:   09/30/20 0854 09/30/20 1015 09/30/20 1128 09/30/20 1214  BP:  (!) 133/35 (!) 87/37 111/77  Pulse: (!) 119 (!) 125 (!) 131   Resp: (!) 48 (!) 35 (!) 32   Temp:  98.5 F (36.9 C) 99 F (37.2 C) 99 F (37.2 C)  TempSrc:  Axillary Axillary Axillary  SpO2: (!) 77% (!) 83% (!) 87%   Weight:      Height:        Intake/Output Summary (Last 24 hours) at 09/30/2020 1229 Last data filed at 09/29/2020 1452 Gross per 24 hour  Intake 120 ml  Output --  Net 120 ml   Filed Weights   10/08/2020 2150 09/24/2020 2155 09/11/20 1649  Weight: 78.5  kg 79.4 kg 76.2 kg    Exam   Awake Alert, extremely frail, deconditioned, in respiratory distress . She is tachypneic, with increased work of breathing  Tachycardic, no rubs or gallops  +ve B.Sounds, Abd Soft, No tenderness, No rebound - guarding or rigidity. No Cyanosis, Clubbing or edema, No new Rash or bruise       Lab Results:  Data Reviewed: I have personally reviewed following labs and imaging studies  Recent Labs  Lab 09/24/20 0318 09/24/20 0318 09/25/20 0327 09/26/20 0250 09/27/20 0412 09/28/20 0056 09/30/20 0420  WBC 14.4*   < > 20.7* 20.3* 20.8* 20.3* 19.3*  HGB 11.9*   < > 11.4* 10.4* 10.4* 11.1* 11.5*  HCT 33.1*   < > 31.2* 28.6* 28.9* 31.2* 32.5*  PLT 269   < > 278 261 224 226 188  MCV 77.3*   < > 76.5* 77.5* 78.3* 78.2* 79.7*  MCH 27.8   < > 27.9 28.2 28.2 27.8 28.2  MCHC 36.0   < > 36.5* 36.4* 36.0 35.6 35.4  RDW 12.1   < > 12.2 12.3 12.3 12.5 13.1  LYMPHSABS 1.0  --  1.2 0.9 1.3 0.9  --   MONOABS 0.4  --  0.6 0.6 0.7 0.4  --   EOSABS 0.0  --  0.0 0.0 0.2 0.1  --   BASOSABS 0.0  --  0.0 0.0 0.0 0.0  --    < > = values in this interval not displayed.    Recent Labs  Lab 09/24/20 0318 09/24/20 0318 09/25/20 0327 09/25/20 0328 09/26/20 0250 09/27/20 0411 09/27/20 0412 09/28/20 0056 09/30/20 0420  NA 133*   < > 133*  --  134*  --  134* 135 136  K 3.6   < > 3.7  --  4.3  --  4.6 4.7 4.1  CL 92*   < > 93*  --  96*  --  97* 97* 100  CO2 28   < > 29  --  29  --  25 26 25   GLUCOSE 209*   < > 168*  --  156*  --  160* 138* 97  BUN 23   < > 25*  --  25*  --  27* 20 19  CREATININE 0.86   < > 0.80  --  0.75  --  0.79 0.74 0.69  CALCIUM 9.0   < > 9.3  --  8.9  --  8.9 9.3 9.0  AST 39   < > 45*  --  50*  --  31 30 28   ALT 78*   < > 86*  --  94*  --  81* 76* 60*  ALKPHOS 71   < > 82  --  80  --  89 92 98  BILITOT 0.9   < > 0.5  --  0.5  --  0.6 0.6 0.6  ALBUMIN 2.5*   < > 2.6*  --  2.4*  --  2.5* 2.6* 2.7*  MG 2.0  --  2.0  --  2.0  --  1.9 2.0  --     CRP 1.3*  --  0.5  --  0.5  --  <0.5 1.0*  --   DDIMER 1.48*  --  1.11*  --  1.17*  --  1.62* 1.65*  --   PROCALCITON <0.10  --  <0.10  --  <0.10  --   --   --   --   BNP 44.3  --   --  59.4 70.7 87.8  --  66.0  --    < > = values in this interval not displayed.     No results found for this or any previous visit (from the past 240 hour(s)).    Radiology Studies: DG Chest Port 1 View  Result Date: 09/30/2020 CLINICAL DATA:  Hypoxia.  COVID positive. EXAM: PORTABLE CHEST 1 VIEW COMPARISON:  09/26/2020 FINDINGS: Progressive bilateral airspace disease. Heart size normal. No significant pleural effusion. IMPRESSION: Progression of diffuse bilateral airspace disease compatible with COVID pneumonia. Electronically Signed   By: Marlan Palauharles  Clark M.D.   On: 09/30/2020 09:46      LOS: 20 days   [facility] Hospital[facility] Whitni Pasquini  [facility] Pager on [email or web address]  09/30/2020, 12:29 PM

## 2020-09-30 NOTE — Progress Notes (Signed)
   09/30/20 0854  Assess: MEWS Score  Pulse Rate (!) 119  ECG Heart Rate (!) 120  Resp (!) 48  SpO2 (!) 77 % (MD aware )  O2 Device HFNC;Non-rebreather Mask  O2 Flow Rate (L/min) 60 L/min  FiO2 (%) 100 %  Assess: MEWS Score  MEWS Temp 0  MEWS Systolic 0  MEWS Pulse 2  MEWS RR 3  MEWS LOC 0  MEWS Score 5  MEWS Score Color Red  Assess: if the MEWS score is Yellow or Red  Were vital signs taken at a resting state? Yes  Focused Assessment No change from prior assessment  Early Detection of Sepsis Score *See Row Information* Medium  MEWS guidelines implemented *See Row Information* Yes  Treat  MEWS Interventions Consulted Respiratory Therapy;Other (Comment) (Rapid response initiated)  Pain Scale 0-10  Pain Score 0  Take Vital Signs  Increase Vital Sign Frequency  Red: Q 1hr X 4 then Q 4hr X 4, if remains red, continue Q 4hrs  Escalate  MEWS: Escalate Red: discuss with charge nurse/RN and provider, consider discussing with RRT  Notify: Charge Nurse/RN  Name of Charge Nurse/RN Notified Elisa, RN  Date Charge Nurse/RN Notified 09/30/20  Time Charge Nurse/RN Notified 0820  Notify: Provider  Provider Name/Title Dr. Randol Kern  Date Provider Notified 09/30/20  Time Provider Notified 0830  Notification Type Page  Notification Reason Change in status  Response See new orders  Date of Provider Response 09/30/20  Time of Provider Response [phone]  Notify: Rapid Response  Name of Rapid Response RN Notified Council Mechanic, RN  Date Rapid Response Notified 09/30/20  Time Rapid Response Notified 0820  Document  Patient Outcome Not stable and remains on department  Progress note created (see row info) Yes

## 2020-09-30 NOTE — Significant Event (Signed)
Rapid Response Event Note   Reason for Call :  O2 sats in the 70s, dropped to 59% Per staff she was on 15L salter [place] and NRB.  Initial Focused Assessment:  Patient up to bedside commode.  She became acutely hypoxic and is in significant respiratory distress. Placed on Heated High Flow [place] 60L and 100% with an additional NRB mask at 15 L  Lung sounds are clear,  Using all accessory muscles to breathe.  She is alert and oriented, in sever respiratory distress  BP 113/63  HR 130s  RR 48-52  O2 sat 74  Interventions:  Assisted patient to sitting in the chair.   Her O2 sats slowly improved to 82-84%,  She is breathing a little easier, HR improved to 108. Encouraged purse lip breathing.  PCXR done  0930:  She complains of being more short of breath.  HR increased to 120130 s,  RR 40s  O2 sats 82% on 60L/100% heated high flow and additional NRB.  She has moderate WOB.  Dr Randol Kern spoke with her and her daughter regarding goals of care  Plan of Care:  Goals of care conversation Daughter coming to visit patient.   Event Summary:   MD Notified: Dr Randol Kern at bedside Call Time: 0834 Arrival Time: 1660 End Time: 1015  Marcellina Millin, RN

## 2020-09-30 NOTE — Plan of Care (Signed)
  Problem: Education: Goal: Knowledge of General Education information will improve Description: Including pain rating scale, medication(s)/side effects and non-pharmacologic comfort measures Outcome: Progressing   Problem: Health Behavior/Discharge Planning: Goal: Ability to manage health-related needs will improve Outcome: Progressing   Problem: Clinical Measurements: Goal: Ability to maintain clinical measurements within normal limits will improve Outcome: Progressing Goal: Will remain free from infection Outcome: Progressing Goal: Diagnostic test results will improve Outcome: Progressing Goal: Respiratory complications will improve Outcome: Progressing Goal: Cardiovascular complication will be avoided Outcome: Progressing   Problem: Nutrition: Goal: Adequate nutrition will be maintained Outcome: Progressing   Problem: Coping: Goal: Level of anxiety will decrease Outcome: Progressing   Problem: Elimination: Goal: Will not experience complications related to bowel motility Outcome: Progressing Goal: Will not experience complications related to urinary retention Outcome: Progressing   Problem: Pain Managment: Goal: General experience of comfort will improve Outcome: Progressing   Problem: Safety: Goal: Ability to remain free from injury will improve Outcome: Progressing   Problem: Skin Integrity: Goal: Risk for impaired skin integrity will decrease Outcome: Progressing   Problem: Education: Goal: Knowledge of risk factors and measures for prevention of condition will improve Outcome: Progressing   Problem: Coping: Goal: Psychosocial and spiritual needs will be supported Outcome: Progressing   Problem: Respiratory: Goal: Will maintain a patent airway Outcome: Progressing   Problem: Education: Goal: Knowledge of disease or condition will improve Outcome: Progressing Goal: Understanding of medication regimen will improve Outcome: Progressing Goal:  Individualized Educational Video(s) Outcome: Progressing   Problem: Cardiac: Goal: Ability to achieve and maintain adequate cardiopulmonary perfusion will improve Outcome: Progressing   Problem: Health Behavior/Discharge Planning: Goal: Ability to safely manage health-related needs after discharge will improve Outcome: Progressing

## 2020-10-01 DIAGNOSIS — Z66 Do not resuscitate: Secondary | ICD-10-CM

## 2020-10-01 DIAGNOSIS — Z7189 Other specified counseling: Secondary | ICD-10-CM | POA: Diagnosis not present

## 2020-10-01 DIAGNOSIS — Z515 Encounter for palliative care: Secondary | ICD-10-CM | POA: Diagnosis not present

## 2020-10-01 DIAGNOSIS — J9601 Acute respiratory failure with hypoxia: Secondary | ICD-10-CM | POA: Diagnosis not present

## 2020-10-01 DIAGNOSIS — U071 COVID-19: Secondary | ICD-10-CM | POA: Diagnosis not present

## 2020-10-01 LAB — GLUCOSE, CAPILLARY
Glucose-Capillary: 100 mg/dL — ABNORMAL HIGH (ref 70–99)
Glucose-Capillary: 145 mg/dL — ABNORMAL HIGH (ref 70–99)
Glucose-Capillary: 182 mg/dL — ABNORMAL HIGH (ref 70–99)
Glucose-Capillary: 141 mg/dL — ABNORMAL HIGH (ref 70–99)

## 2020-10-01 NOTE — Consult Note (Signed)
Palliative Medicine Inpatient Consult Note  Reason for consult:  Goals of Care "End of Life"  HPI:  Per intake H&P --> 74 y.o.femalewith medical history significant ofhypertension, former smokerpresented to emergency department with worsening shortness of breath since 4 days.  She is not vaccinated against COVID-19.  Patient was noted to be hypoxic in the emergency department.   Palliative care was asked to aid in goals of care conversations in the setting of severe Covid pneumonia.   Clinical Assessment/Goals of Care: I have reviewed medical records including EPIC notes, labs and imaging, received report from bedside RN, assessed the patient was noted to be on 25 L/min of high flow nasal cannula.  Ms. Mickelson was alert and oriented to person place and time and able to state goals clearly.   I met with Abbie Sons and her daughter Harrietta Guardian to further discuss diagnosis prognosis, Hope, EOL wishes, disposition and options.   I introduced Palliative Medicine as specialized medical care for people living with serious illness. It focuses on providing relief from the symptoms and stress of a serious illness. The goal is to improve quality of life for both the patient and the family.  I asked Ms. Dicker to tell me about herself.  She shared with me that she is from [place], [place].  She has lived in [place] throughout the duration of her life.  She currently lives in the [place] area with her daughter Jinny Sanders.  She is not presently married though she does have 6 children - 4 boys and 2 girls.  She also shares that she has 15 grandchildren.  She used to work at [employer] and then at [employer].  She and her later years worked at [employer]  In terms of things she enjoys doing she loves shopping and going out to eat.  She emphasizes that she truly enjoys good cuisine her favorite types of food are [place]. [place] is a devout member of the [employer].  Prior to Halie's Covid pneumonia she was fully functional without any assistive aid.  She was able to perform all basic activities of daily living and some instrumental activities of daily living.  Her daughter shares that [place] used to make dinner for her.  We discussed Covid pneumonia and the severity and implications of this diagnosis.  We discussed how difficult it is if someone already has poor lung integrity to reach meaningful recovery.  [place] vocalized understanding of these concepts.  A detailed discussion was had today regarding advanced directives -patient does not have any noted on file though she does defer to her daughter Jinny Sanders for advance care decision making.    Concepts specific to code status, artifical feeding and hydration, continued IV antibiotics and rehospitalization was had.  The difference between a aggressive medical intervention path  and a palliative comfort care path for this patient at this time was had. I completed a MOST form today. The patient and family outlined their wishes for the following treatment decisions:  Cardiopulmonary Resuscitation: Do Not Attempt Resuscitation (DNR/No CPR)  Medical Interventions: Comfort Measures: Keep clean, warm, and dry. Use medication by any route, positioning, wound care, and other measures to relieve pain and suffering. Use oxygen, suction and manual treatment of airway obstruction as needed for comfort. Do not transfer to the hospital unless comfort needs cannot be met in current location.  Antibiotics: No antibiotics (use other measures to relieve symptoms)  IV Fluids: No IV fluids (provide other measures  to ensure comfort)  Feeding Tube: No feeding tube   Values and goals of care important to patient and family were attempted to be elicited.  [place] values more than anything time with her family.  They are identified as the most important part of her life.  We further discussed her spending time with her  family and afterwards transitioning focus to comfort.  We talked about transition to comfort measures in house and what that would entail inclusive of medications to control pain, dyspnea, agitation, nausea, itching, and hiccups.   We discussed stopping all uneccessary measures such as blood draws, needle sticks, and frequent vital signs.   Discussed the importance of continued conversation with family and their  medical providers regarding overall plan of care and treatment options, ensuring decisions are within the context of the patients values and GOCs.  Decision Maker: Jinny Sanders [phone]  SUMMARY OF RECOMMENDATIONS   Do not attempt resuscitation do not attempt intubation  MOST form completed - will be scanned into [employer]  Patient would like to have the opportunity to spend time with her family after which she has stated she would be ready for a comfort oriented path of care  Spiritual support-[employer] denomination  Ongoing support from the palliative medicine team  Code Status/Advance Care Planning: DNAR/DNI   Palliative Prophylaxis:   Oral care, delirium precautions, aspiration precautions, pain management  Additional Recommendations (Limitations, Scope, Preferences):  Continue current scope of care when patient and family ready will transition focus to comfort    Psycho-social/Spiritual:   Desire for further Chaplaincy support:  Yes-[employer] Additional Recommendations:  Education on end-of-life care planning.  Education on symptom-based relief during end-of-life.    Prognosis:  Given the amount of oxygen patient is on and the inability to mobilize without severe desaturations, suspect that her time will be short limited hours 2 days once transition to comfort oriented care  Discharge Planning:  Charge will more than likely be celestial  PPS: 10%   This conversation/these recommendations were discussed with patient primary care team, Dr. Waldron Labs  Time In:  0645 Time Out: 0800 Total Time: 75 Greater than 50%  of this time was spent counseling and coordinating care related to the above assessment and plan.  [facility] Team Team Cell Phone: [phone] Please utilize secure chat with additional questions, if there is no response within 30 minutes please call the above phone number  Palliative Medicine Team providers are available by phone from 7am to 7pm daily and can be reached through the team cell phone.  Should this patient require assistance outside of these hours, please call the patient's attending physician.

## 2020-10-01 NOTE — Plan of Care (Signed)
Pt is still experience difficulty maintaining respiratory goals due to lack of activity tolerance. Will continue to monitor.   Problem: Education: Goal: Knowledge of General Education information will improve Description: Including pain rating scale, medication(s)/side effects and non-pharmacologic comfort measures Outcome: Progressing   Problem: Health Behavior/Discharge Planning: Goal: Ability to manage health-related needs will improve Outcome: Progressing   Problem: Clinical Measurements: Goal: Ability to maintain clinical measurements within normal limits will improve Outcome: Progressing Goal: Will remain free from infection Outcome: Progressing Goal: Diagnostic test results will improve Outcome: Progressing Goal: Cardiovascular complication will be avoided Outcome: Progressing   Problem: Nutrition: Goal: Adequate nutrition will be maintained Outcome: Progressing   Problem: Coping: Goal: Level of anxiety will decrease Outcome: Progressing   Problem: Elimination: Goal: Will not experience complications related to bowel motility Outcome: Progressing Goal: Will not experience complications related to urinary retention Outcome: Progressing   Problem: Pain Managment: Goal: General experience of comfort will improve Outcome: Progressing   Problem: Safety: Goal: Ability to remain free from injury will improve Outcome: Progressing   Problem: Skin Integrity: Goal: Risk for impaired skin integrity will decrease Outcome: Progressing   Problem: Education: Goal: Knowledge of risk factors and measures for prevention of condition will improve Outcome: Progressing   Problem: Coping: Goal: Psychosocial and spiritual needs will be supported Outcome: Progressing   Problem: Respiratory: Goal: Will maintain a patent airway Outcome: Progressing Goal: Complications related to the disease process, condition or treatment will be avoided or minimized Outcome: Progressing    Problem: Education: Goal: Knowledge of disease or condition will improve Outcome: Progressing Goal: Understanding of medication regimen will improve Outcome: Progressing Goal: Individualized Educational Video(s) Outcome: Progressing   Problem: Cardiac: Goal: Ability to achieve and maintain adequate cardiopulmonary perfusion will improve Outcome: Progressing   Problem: Health Behavior/Discharge Planning: Goal: Ability to safely manage health-related needs after discharge will improve Outcome: Progressing

## 2020-10-01 NOTE — Plan of Care (Signed)
Patient is currently resting in bed. Denies pain. VSS. Currently on 25L 100% HHFNC. Family at bedside. Purewick in place. Call bell within reach. Bed alarm on.  Problem: Education: Goal: Knowledge of General Education information will improve Description: Including pain rating scale, medication(s)/side effects and non-pharmacologic comfort measures Outcome: Progressing   Problem: Health Behavior/Discharge Planning: Goal: Ability to manage health-related needs will improve Outcome: Progressing   Problem: Clinical Measurements: Goal: Ability to maintain clinical measurements within normal limits will improve Outcome: Progressing Goal: Will remain free from infection Outcome: Progressing Goal: Diagnostic test results will improve Outcome: Progressing Goal: Respiratory complications will improve Outcome: Progressing Goal: Cardiovascular complication will be avoided Outcome: Progressing   Problem: Activity: Goal: Risk for activity intolerance will decrease Outcome: Progressing   Problem: Nutrition: Goal: Adequate nutrition will be maintained Outcome: Progressing   Problem: Coping: Goal: Level of anxiety will decrease Outcome: Progressing   Problem: Elimination: Goal: Will not experience complications related to bowel motility Outcome: Progressing Goal: Will not experience complications related to urinary retention Outcome: Progressing   Problem: Pain Managment: Goal: General experience of comfort will improve Outcome: Progressing   Problem: Safety: Goal: Ability to remain free from injury will improve Outcome: Progressing   Problem: Skin Integrity: Goal: Risk for impaired skin integrity will decrease Outcome: Progressing   Problem: Education: Goal: Knowledge of risk factors and measures for prevention of condition will improve Outcome: Progressing   Problem: Coping: Goal: Psychosocial and spiritual needs will be supported Outcome: Progressing   Problem:  Respiratory: Goal: Will maintain a patent airway Outcome: Progressing Goal: Complications related to the disease process, condition or treatment will be avoided or minimized Outcome: Progressing   Problem: Education: Goal: Knowledge of disease or condition will improve Outcome: Progressing Goal: Understanding of medication regimen will improve Outcome: Progressing Goal: Individualized Educational Video(s) Outcome: Progressing   Problem: Activity: Goal: Ability to tolerate increased activity will improve Outcome: Progressing   Problem: Cardiac: Goal: Ability to achieve and maintain adequate cardiopulmonary perfusion will improve Outcome: Progressing   Problem: Health Behavior/Discharge Planning: Goal: Ability to safely manage health-related needs after discharge will improve Outcome: Progressing

## 2020-10-01 NOTE — Progress Notes (Signed)
OT Cancellation Note  Patient Details Name: Hannah Torres MRN: [id number] DOB: September 25, 1946   Cancelled Treatment:    Reason Eval/Treat Not Completed: Medical issues which prohibited therapy. Per MD, hold therapies today due to rapid decompensation with all activity attempts. Pt having rapid response called yesterday due to SpO2. Pt currently having family visits will possible transition to comfort care after visits are completed. Will follow up next week. Thank you.  [name] [name] [name] MSOT, OTR/L Acute Rehab Pager: [phone] Office: [phone] 10/01/2020, 2:23 PM

## 2020-10-01 NOTE — Progress Notes (Signed)
PT Cancellation Note  Patient Details Name: Hannah Torres MRN: [id number] DOB: [birth year]   Cancelled Treatment:    Reason Eval/Treat Not Completed: Medical issues which prohibited therapy. Per MD, hold PT Rx today due to rapid decompensation with all mobility attempts. Pt currently having family visits will possible transition to comfort care after visits are completed. PT to follow up Monday.   Ilda Foil 10/01/2020, 12:20 PM  Aida Raider, PT  Office # [phone] Pager [phone]

## 2020-10-01 NOTE — Progress Notes (Signed)
PROGRESS NOTE  Hannah Torres CNO:[id number] DOB: June 23, 1946 DOA: 22-Sep-2020  PCP: Pcp, No  Brief History/Interval Summary:   - 74 y.o. female with medical history significant of hypertension, former smoker presented to emergency department with worsening shortness of breath since 4 days.  She is not vaccinated against COVID-19.  Patient was noted to be hypoxic in the emergency department.  Chest x-ray showed bilateral opacities.  Patient was hospitalized for further management.  Reason for Visit: Pneumonia due to COVID-19.  Acute respiratory failure with hypoxia  Consultants: None  Procedures: None  Antibiotics: Anti-infectives (From admission, onward)   Start     Dose/Rate Route Frequency Ordered Stop   09/11/20 1000  remdesivir 100 mg in sodium chloride 0.9 % 100 mL IVPB       "Followed by" Linked Group Details   100 mg 200 mL/hr over 30 Minutes Intravenous Daily 2020/09/22 1815 09/14/20 1134   2020-09-22 2000  remdesivir 200 mg in sodium chloride 0.9% 250 mL IVPB       "Followed by" Linked Group Details   200 mg 580 mL/hr over 30 Minutes Intravenous Once 09/22/2020 1815 Sep 22, 2020 2228   09/22/20 1530  cefTRIAXone (ROCEPHIN) 2 g in sodium chloride 0.9 % 100 mL IVPB  Status:  Discontinued        2 g 200 mL/hr over 30 Minutes Intravenous Every 24 hours September 22, 2020 1517 09/11/20 0704   09-22-2020 1530  azithromycin (ZITHROMAX) 500 mg in sodium chloride 0.9 % 250 mL IVPB  Status:  Discontinued        500 mg 250 mL/hr over 60 Minutes Intravenous Every 24 hours 09-22-20 1517 09/11/20 0704      Subjective/Interval History:     She is with significant dyspnea this morning when she went out of bed to commode, with significant increase of oxygen requirement .  Assessment/Plan:  Acute Hypoxic Resp. Failure/Pneumonia due to COVID-19 + DVT and possible PE  - She is unfortunately not vaccinated for Covid and has incurred severe parenchymal lung injury due to COVID-19 pneumonia,  -she has been  treated with IV steroids, she has been tapered gradually, she is currently on 40 mg of IV Solu-Medrol once daily. -She was treated with Remdesivir  - she was  Treated with  Baricitinib.  - Lasix as needed being used as well,  -Patient unfortunately with significant oxygen requirement, cannot tolerate any movement, this morning have tried to stand her up, she almost passed out, with significantly increased work of breathing, oxygen requirement, had to go up to 70 L high flow and NRB together, for that she has been tolerating 3 L heated high flow nasal cannula, please see discussion below under goals of care. - her chest x-ray 10/21 showing worsening COVID-19 positive test (U07.1, COVID-19) with Acute Pneumonia (J12.89, Other viral pneumonia) (If respiratory failure or sepsis present, add as separate assessment)   Encouraged her to sit up in chair in daytime use I-S and flutter valve for pulmonary toiletry and prone at night if possible.  SpO2: 100 % O2 Flow Rate (L/min): 40 L/min FiO2 (%): 90 %    Recent Labs  Lab 09/25/20 0327 09/25/20 0328 09/26/20 0250 09/27/20 0411 09/27/20 0412 09/28/20 0056 09/30/20 0420  WBC 20.7*  --  20.3*  --  20.8* 20.3* 19.3*  CRP 0.5  --  0.5  --  <0.5 1.0*  --   DDIMER 1.11*  --  1.17*  --  1.62* 1.65*  --   BNP  --  59.4 70.7 87.8  --  66.0  --   PROCALCITON <0.10  --  <0.10  --   --   --   --   AST 45*  --  50*  --  31 30 28   ALT 86*  --  94*  --  81* 76* 60*  ALKPHOS 82  --  80  --  89 92 98  BILITOT 0.5  --  0.5  --  0.6 0.6 0.6  ALBUMIN 2.6*  --  2.4*  --  2.5* 2.6* 2.7*       Acute DVT bilateral lower extremities/presumed PE  -  Patient found to have a bilateral lower extremity DVT.  Has been on full dose Lovenox will transition her to Eliquis on 09/24/2020 as D-dimer is now trending down, treatment will be 6 months total.  Atrial fibrillation with RVR with [place] vas 2 score of greater than 3.  - No previous history of same.  Stable  echocardiogram and TSH, was on Cardizem infusion, and 2 doses of IV digoxin, currently appears stable on high-dose oral beta-blocker which will be continued.  Already on full dose anticoagulation due to bilateral lower DVTs and presumed PE.  Transaminitis  - Secondary to COVID-19 and possibly remdesivir use.  She is asymptomatic and has stable RUQ US - now stable trend .  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  Lab Results  Component Value Date   TSH 0.183 (L) 09/18/2020    Essential hypertension  - B.Blocker.  Acute kidney injury - caused by ATN from infection, resolved after hydration.  Thrombocytosis/leukocytosis - Likely reactive due to acute infection.  Elevated WBC likely due to steroids.  Platelet counts have improved.  Hyperglycemia - No known history of diabetes.  HbA1c 6.4.  Hyperglycemia likely due to steroids.  Continue low-dose Lantus and SSI.  CBG (last 3)  Recent Labs    09/30/20 1636 09/30/20 2025 10/01/20 0752  GLUCAP 157* 99 100*    Oral thrush/candidiasis -Started on Magic mouthwash.  Goals of care: -Patient having an episodes of significant hypoxia and rapid deterioration with minimal activity, I have discussed with daughter Clerance LavMarianne, about goals of care, and patient at extremely high risk for death deterioration and single way, approach goals of care discussion, patient with multiple family members, palliative care input greatly appreciated, for now continue with current measures allowing for multiple family members to visit, will continue to reassess medical condition frequently, and if she continues to have frequent rapid deterioration, then likely will proceed with comfort measures once patient had family visitations finished.   DVT Prophylaxis: On therapeutic Lovenox Code Status: DNR Family Communication: Daughter Armando ReichertMarian at bedside Disposition Plan: remains unclear  Status  is: Inpatient  Remains inpatient appropriate because:IV treatments appropriate due to intensity of illness or inability to take PO and Inpatient level of care appropriate due to severity of illness   Dispo:  Patient From:    Planned Disposition:    Expected discharge date: 10/09/2020  Medically stable for discharge:     Medications:  Scheduled: . apixaban  5 mg Oral BID  . vitamin C  500 mg Oral Daily  . famotidine  20 mg Oral Daily  . feeding supplement  237 mL Oral TID BM  . influenza vaccine adjuvanted  0.5 mL Intramuscular Tomorrow-1000  . insulin aspart  0-20 Units Subcutaneous TID WC  . insulin aspart  0-5  Units Subcutaneous QHS  . insulin glargine  8 Units Subcutaneous QHS  . magic mouthwash  5 mL Oral QID  . methylPREDNISolone (SOLU-MEDROL) injection  40 mg Intravenous Daily  . metoprolol tartrate  50 mg Oral BID  . mometasone-formoterol  2 puff Inhalation BID  . multivitamin with minerals  1 tablet Oral Daily  . pantoprazole  40 mg Oral Daily  . zinc sulfate  220 mg Oral Daily   Continuous:  LFY:BOFBPZWCHENID, chlorpheniramine-HYDROcodone, diltiazem, guaiFENesin-dextromethorphan, lip balm, loperamide, morphine injection, [DISCONTINUED] ondansetron **OR** ondansetron (ZOFRAN) IV, phenol, polyethylene glycol, sodium chloride   Objective:  Vital Signs  Vitals:   10/01/20 0520 10/01/20 0559 10/01/20 0809 10/01/20 0902  BP: 113/66  120/61 120/61  Pulse: 77  98 83  Resp: 18 16 20  (!) 22  Temp: 98.1 F (36.7 C)  97.9 F (36.6 C)   TempSrc: Axillary  Oral   SpO2: 99% 99% 90% 100%  Weight:      Height:        Intake/Output Summary (Last 24 hours) at 10/01/2020 1152 Last data filed at 09/30/2020 1600 Gross per 24 hour  Intake 120 ml  Output --  Net 120 ml   Filed Weights   09/16/2020 2150 10/04/2020 2155 09/11/20 1649  Weight: 78.5 kg 79.4 kg 76.2 kg    Exam   Awake Alert, extremely frail, deconditioned, in no apparent distress He is tachypneic, with  some increased work of breathing, scattered rales RRR,No Gallops,Rubs or new Murmurs, No Parasternal Heave +ve B.Sounds, Abd Soft, No tenderness, No rebound - guarding or rigidity. No Cyanosis, Clubbing or edema, No new Rash or bruise    Lab Results:  Data Reviewed: I have personally reviewed following labs and imaging studies  Recent Labs  Lab 09/25/20 0327 09/26/20 0250 09/27/20 0412 09/28/20 0056 09/30/20 0420  WBC 20.7* 20.3* 20.8* 20.3* 19.3*  HGB 11.4* 10.4* 10.4* 11.1* 11.5*  HCT 31.2* 28.6* 28.9* 31.2* 32.5*  PLT 278 261 224 226 188  MCV 76.5* 77.5* 78.3* 78.2* 79.7*  MCH 27.9 28.2 28.2 27.8 28.2  MCHC 36.5* 36.4* 36.0 35.6 35.4  RDW 12.2 12.3 12.3 12.5 13.1  LYMPHSABS 1.2 0.9 1.3 0.9  --   MONOABS 0.6 0.6 0.7 0.4  --   EOSABS 0.0 0.0 0.2 0.1  --   BASOSABS 0.0 0.0 0.0 0.0  --     Recent Labs  Lab 09/25/20 0327 09/25/20 0328 09/26/20 0250 09/27/20 0411 09/27/20 0412 09/28/20 0056 09/30/20 0420  NA 133*  --  134*  --  134* 135 136  K 3.7  --  4.3  --  4.6 4.7 4.1  CL 93*  --  96*  --  97* 97* 100  CO2 29  --  29  --  25 26 25   GLUCOSE 168*  --  156*  --  160* 138* 97  BUN 25*  --  25*  --  27* 20 19  CREATININE 0.80  --  0.75  --  0.79 0.74 0.69  CALCIUM 9.3  --  8.9  --  8.9 9.3 9.0  AST 45*  --  50*  --  31 30 28   ALT 86*  --  94*  --  81* 76* 60*  ALKPHOS 82  --  80  --  89 92 98  BILITOT 0.5  --  0.5  --  0.6 0.6 0.6  ALBUMIN 2.6*  --  2.4*  --  2.5* 2.6* 2.7*  MG 2.0  --  2.0  --  1.9 2.0  --   CRP 0.5  --  0.5  --  <0.5 1.0*  --   DDIMER 1.11*  --  1.17*  --  1.62* 1.65*  --   PROCALCITON <0.10  --  <0.10  --   --   --   --   BNP  --  59.4 70.7 87.8  --  66.0  --      No results found for this or any previous visit (from the past 240 hour(s)).    Radiology Studies: DG Chest Port 1 View  Result Date: 09/30/2020 CLINICAL DATA:  Hypoxia.  COVID positive. EXAM: PORTABLE CHEST 1 VIEW COMPARISON:  09/26/2020 FINDINGS: Progressive bilateral  airspace disease. Heart size normal. No significant pleural effusion. IMPRESSION: Progression of diffuse bilateral airspace disease compatible with COVID pneumonia. Electronically Signed   By: Marlan Palau M.D.   On: 09/30/2020 09:46      LOS: 21 days   Alizon Schmeling  [facility] Pager on [email or web address]  10/01/2020, 11:52 AM

## 2020-10-02 DIAGNOSIS — U071 COVID-19: Secondary | ICD-10-CM | POA: Diagnosis not present

## 2020-10-02 DIAGNOSIS — Z66 Do not resuscitate: Secondary | ICD-10-CM | POA: Diagnosis not present

## 2020-10-02 DIAGNOSIS — Z515 Encounter for palliative care: Secondary | ICD-10-CM | POA: Diagnosis not present

## 2020-10-02 DIAGNOSIS — Z7189 Other specified counseling: Secondary | ICD-10-CM | POA: Diagnosis not present

## 2020-10-02 DIAGNOSIS — J9601 Acute respiratory failure with hypoxia: Secondary | ICD-10-CM | POA: Diagnosis not present

## 2020-10-02 LAB — GLUCOSE, CAPILLARY
Glucose-Capillary: 97 mg/dL (ref 70–99)
Glucose-Capillary: 185 mg/dL — ABNORMAL HIGH (ref 70–99)
Glucose-Capillary: 184 mg/dL — ABNORMAL HIGH (ref 70–99)
Glucose-Capillary: 143 mg/dL — ABNORMAL HIGH (ref 70–99)

## 2020-10-02 NOTE — Plan of Care (Signed)
Patient is currently resting in bed. Denies pain. Daughter at bedside. VSS. HHFNC weaned to 20L at 100%. Repositioned for comfort. Purewick in place. No complaints overnight. Call bell within reach. Bed alarm on.   Problem: Education: Goal: Knowledge of General Education information will improve Description: Including pain rating scale, medication(s)/side effects and non-pharmacologic comfort measures Outcome: Progressing   Problem: Health Behavior/Discharge Planning: Goal: Ability to manage health-related needs will improve Outcome: Progressing   Problem: Clinical Measurements: Goal: Ability to maintain clinical measurements within normal limits will improve Outcome: Progressing Goal: Will remain free from infection Outcome: Progressing Goal: Diagnostic test results will improve Outcome: Progressing Goal: Respiratory complications will improve Outcome: Progressing Goal: Cardiovascular complication will be avoided Outcome: Progressing   Problem: Activity: Goal: Risk for activity intolerance will decrease Outcome: Progressing   Problem: Nutrition: Goal: Adequate nutrition will be maintained Outcome: Progressing   Problem: Coping: Goal: Level of anxiety will decrease Outcome: Progressing   Problem: Elimination: Goal: Will not experience complications related to bowel motility Outcome: Progressing Goal: Will not experience complications related to urinary retention Outcome: Progressing   Problem: Pain Managment: Goal: General experience of comfort will improve Outcome: Progressing   Problem: Safety: Goal: Ability to remain free from injury will improve Outcome: Progressing   Problem: Skin Integrity: Goal: Risk for impaired skin integrity will decrease Outcome: Progressing   Problem: Education: Goal: Knowledge of risk factors and measures for prevention of condition will improve Outcome: Progressing   Problem: Coping: Goal: Psychosocial and spiritual needs will  be supported Outcome: Progressing   Problem: Respiratory: Goal: Will maintain a patent airway Outcome: Progressing Goal: Complications related to the disease process, condition or treatment will be avoided or minimized Outcome: Progressing   Problem: Education: Goal: Knowledge of disease or condition will improve Outcome: Progressing Goal: Understanding of medication regimen will improve Outcome: Progressing Goal: Individualized Educational Video(s) Outcome: Progressing   Problem: Activity: Goal: Ability to tolerate increased activity will improve Outcome: Progressing   Problem: Cardiac: Goal: Ability to achieve and maintain adequate cardiopulmonary perfusion will improve Outcome: Progressing   Problem: Health Behavior/Discharge Planning: Goal: Ability to safely manage health-related needs after discharge will improve Outcome: Progressing

## 2020-10-02 NOTE — Progress Notes (Signed)
PROGRESS NOTE  Hannah Torres MBW:[id number] DOB: 1946-01-31 DOA: 09/11/2020  PCP: Pcp, No  Brief History/Interval Summary:   - 74 y.o. female with medical history significant of hypertension, former smoker presented to emergency department with worsening shortness of breath since 4 days.  She is not vaccinated against COVID-19.  Patient was noted to be hypoxic in the emergency department.  Chest x-ray showed bilateral opacities.  Patient was hospitalized for further management.  Reason for Visit: Pneumonia due to COVID-19.  Acute respiratory failure with hypoxia  Consultants: None  Procedures: None  Antibiotics: Anti-infectives (From admission, onward)   Start     Dose/Rate Route Frequency Ordered Stop   09/11/20 1000  remdesivir 100 mg in sodium chloride 0.9 % 100 mL IVPB       "Followed by" Linked Group Details   100 mg 200 mL/hr over 30 Minutes Intravenous Daily 09/11/2020 1815 09/14/20 1134   09/27/2020 2000  remdesivir 200 mg in sodium chloride 0.9% 250 mL IVPB       "Followed by" Linked Group Details   200 mg 580 mL/hr over 30 Minutes Intravenous Once 09/11/2020 1815 09/19/2020 2228   09/14/2020 1530  cefTRIAXone (ROCEPHIN) 2 g in sodium chloride 0.9 % 100 mL IVPB  Status:  Discontinued        2 g 200 mL/hr over 30 Minutes Intravenous Every 24 hours 10/03/2020 1517 09/11/20 0704   09/20/2020 1530  azithromycin (ZITHROMAX) 500 mg in sodium chloride 0.9 % 250 mL IVPB  Status:  Discontinued        500 mg 250 mL/hr over 60 Minutes Intravenous Every 24 hours 10/01/2020 1517 09/11/20 0704      Subjective/Interval History:     Respiratory status remains very tenuous, she had a good night sleep, this morning she became severely dyspneic and in respiratory distress with increased work of breathing, with only taking 1 tablet of her medications  Assessment/Plan:  Acute Hypoxic Resp. Failure/Pneumonia due to COVID-19 + DVT and possible PE  - She is unfortunately not vaccinated for Covid and  has incurred severe parenchymal lung injury due to COVID-19 pneumonia,  -she has been treated with IV steroids, she has been tapered gradually, she is currently on 40 mg of IV Solu-Medrol once daily. -She was treated with Remdesivir  - she was  Treated with  Baricitinib.  - Lasix as needed being used as well,  -Patient unfortunately with severe lung disease from Covid, with a repeat x-ray showing worsening of her bilateral opacity due to COVID-19 pneumonia, respiratory status remains very tenuous, she is seemingly frail, deconditioned, goes into severe respiratory distress with very minimal activity, even taking his meds or eating, and moving in the bed where she needs to go up to 70 L high flow nasal cannula and NRB together . - her chest x-ray 10/21 showing worsening COVID-19 positive test (U07.1, COVID-19) with Acute Pneumonia (J12.89, Other viral pneumonia) (If respiratory failure or sepsis present, add as separate assessment)  SpO2: 97 % O2 Flow Rate (L/min): 20 L/min FiO2 (%): 100 %    Recent Labs  Lab 09/26/20 0250 09/27/20 0411 09/27/20 0412 09/28/20 0056 09/30/20 0420  WBC 20.3*  --  20.8* 20.3* 19.3*  CRP 0.5  --  <0.5 1.0*  --   DDIMER 1.17*  --  1.62* 1.65*  --   BNP 70.7 87.8  --  66.0  --   PROCALCITON <0.10  --   --   --   --  AST 50*  --  31 30 28   ALT 94*  --  81* 76* 60*  ALKPHOS 80  --  89 92 98  BILITOT 0.5  --  0.6 0.6 0.6  ALBUMIN 2.4*  --  2.5* 2.6* 2.7*       Acute DVT bilateral lower extremities/presumed PE  -  Patient found to have a bilateral lower extremity DVT.  Has been on full dose Lovenox will transition her to Eliquis on 09/24/2020 as D-dimer is now trending down, treatment will be 6 months total.  Atrial fibrillation with RVR with 09/26/2020 vas 2 score of greater than 3.  - No previous history of same.  Stable echocardiogram and TSH, was on Cardizem infusion, and 2 doses of IV digoxin, currently appears stable on high-dose oral beta-blocker which  will be continued.  Already on full dose anticoagulation due to bilateral lower DVTs and presumed PE.  Transaminitis  - Secondary to COVID-19 and possibly remdesivir use.  She is asymptomatic and has stable RUQ [place] - now stable trend .  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  Lab Results  Component Value Date   TSH 0.183 (L) 09/18/2020    Essential hypertension  - B.Blocker.  Acute kidney injury - caused by ATN from infection, resolved after hydration.  Thrombocytosis/leukocytosis - Likely reactive due to acute infection.  Elevated WBC likely due to steroids.  Platelet counts have improved.  Hyperglycemia - No known history of diabetes.  HbA1c 6.4.  Hyperglycemia likely due to steroids.  Continue low-dose Lantus and SSI.  CBG (last 3)  Recent Labs    10/01/20 2047 10/02/20 0758 10/02/20 1227  GLUCAP 141* 97 185*    Oral thrush/candidiasis -Started on Magic mouthwash.  Goals of care: -Patient having an episodes of significant hypoxia and rapid deterioration with minimal activity, I have discussed with daughter 10/04/20, about goals of care, and patient at extremely high risk for death and deterioration , did approach goals of care discussion, patient with multiple family members, palliative care input greatly appreciated, for now continue with current measures allowing for multiple family members to visit, will continue to reassess medical condition frequently, and if she continues to have frequent rapid deterioration, patient and family are considering comfort care at this point, given she has extremely no reserve, significant discomfort respiratory distress with minimal activity, palliative medicine is following, she is on as needed morphine as well given she is quite symptomatic with these episodes .  DVT Prophylaxis: On therapeutic Lovenox Code Status: DNR Family Communication: Daughter Clerance Lav at  bedside Disposition Plan: remains unclear  Status is: Inpatient  Remains inpatient appropriate because:IV treatments appropriate due to intensity of illness or inability to take PO and Inpatient level of care appropriate due to severity of illness   Dispo:  Patient From:    Planned Disposition:    Expected discharge date: 10/09/2020  Medically stable for discharge:     Medications:  Scheduled: . apixaban  5 mg Oral BID  . vitamin C  500 mg Oral Daily  . famotidine  20 mg Oral Daily  . feeding supplement  237 mL Oral TID BM  . influenza vaccine adjuvanted  0.5 mL Intramuscular Tomorrow-1000  . insulin aspart  0-20 Units Subcutaneous TID WC  . insulin aspart  0-5 Units Subcutaneous QHS  . insulin glargine  8 Units Subcutaneous QHS  . magic mouthwash  5 mL Oral  QID  . methylPREDNISolone (SOLU-MEDROL) injection  40 mg Intravenous Daily  . metoprolol tartrate  50 mg Oral BID  . mometasone-formoterol  2 puff Inhalation BID  . multivitamin with minerals  1 tablet Oral Daily  . pantoprazole  40 mg Oral Daily  . zinc sulfate  220 mg Oral Daily   Continuous:  QMG:[id number], chlorpheniramine-HYDROcodone, diltiazem, guaiFENesin-dextromethorphan, lip balm, loperamide, morphine injection, [DISCONTINUED] ondansetron **OR** ondansetron (ZOFRAN) IV, phenol, polyethylene glycol, sodium chloride   Objective:  Vital Signs  Vitals:   10/02/20 0000 10/02/20 0225 10/02/20 0400 10/02/20 0753  BP: 105/60  (!) 104/54 135/62  Pulse: 98 95 84 83  Resp: (!) 22 (!) 28 (!) 22 (!) 25  Temp: 98.5 F (36.9 C)  98 F (36.7 C) 97.7 F (36.5 C)  TempSrc: Oral  Oral Oral  SpO2: 99%  97%   Weight:      Height:       No intake or output data in the 24 hours ending 10/02/20 1314 Filed Weights   09/28/2020 2150 09/13/2020 2155 09/11/20 1649  Weight: 78.5 kg 79.4 kg 76.2 kg    Exam   Awake Alert, Oriented X 3, extremely frail, in mild distress due to dyspnea Symmetrical Chest wall movement,  Good air movement bilaterally, scattered rales RRR,No Gallops,Rubs or new Murmurs, No Parasternal Heave +ve B.Sounds, Abd Soft, No tenderness, No rebound - guarding or rigidity. No Cyanosis, Clubbing or edema, No new Rash or bruise     Lab Results:  Data Reviewed: I have personally reviewed following labs and imaging studies  Recent Labs  Lab 09/26/20 0250 09/27/20 0412 09/28/20 0056 09/30/20 0420  WBC 20.3* 20.8* 20.3* 19.3*  HGB 10.4* 10.4* 11.1* 11.5*  HCT 28.6* 28.9* 31.2* 32.5*  PLT 261 224 226 188  MCV 77.5* 78.3* 78.2* 79.7*  MCH 28.2 28.2 27.8 28.2  MCHC 36.4* 36.0 35.6 35.4  RDW 12.3 12.3 12.5 13.1  LYMPHSABS 0.9 1.3 0.9  --   MONOABS 0.6 0.7 0.4  --   EOSABS 0.0 0.2 0.1  --   BASOSABS 0.0 0.0 0.0  --     Recent Labs  Lab 09/26/20 0250 09/27/20 0411 09/27/20 0412 09/28/20 0056 09/30/20 0420  NA 134*  --  134* 135 136  K 4.3  --  4.6 4.7 4.1  CL 96*  --  97* 97* 100  CO2 29  --  25 26 25   GLUCOSE 156*  --  160* 138* 97  BUN 25*  --  27* 20 19  CREATININE 0.75  --  0.79 0.74 0.69  CALCIUM 8.9  --  8.9 9.3 9.0  AST 50*  --  31 30 28   ALT 94*  --  81* 76* 60*  ALKPHOS 80  --  89 92 98  BILITOT 0.5  --  0.6 0.6 0.6  ALBUMIN 2.4*  --  2.5* 2.6* 2.7*  MG 2.0  --  1.9 2.0  --   CRP 0.5  --  <0.5 1.0*  --   DDIMER 1.17*  --  1.62* 1.65*  --   PROCALCITON <0.10  --   --   --   --   BNP 70.7 87.8  --  66.0  --      No results found for this or any previous visit (from the past 240 hour(s)).    Radiology Studies: No results found.    LOS: 22 days   on [email or web address]  10/02/2020, 1:14  PM

## 2020-10-02 NOTE — Progress Notes (Signed)
   Palliative Medicine Inpatient Follow Up Note   Reason for consult:  Goals of Care "End of Life"  HPI:  Per intake H&P --> 74 y.o.femalewith medical history significant ofhypertension, former smokerpresented to emergency department with worsening shortness of breath since 4 days. She is not vaccinated against COVID-19. Patient was noted to be hypoxic in the emergency department.   Palliative care was asked to aid in goals of care conversations in the setting of severe Covid pneumonia.  Today's Discussion (10/02/2020): Chart reviewed. Patient on 20LPM HFNC. I met with [place] at bedside. She looks quite short of breath this morning. She had apparently gotten extremely out of breath after taking her medications. Per discuss with Dr. Waldron Labs he had a long conversation with the patient and her daughter regarding how things are evolving. At this juncture it does appear that even with slight movement there are severe episodes of desaturations.  [place] shares that she "knows she will not be leaving the hospital". She would like to have the opportunity to speak with her daughter move extensively prior to determining transition to comfort focused care. Explained again what comfort focused care would look like. [place] spent time to interpret this information.   I called Jinny Sanders after leaving the room. She shared with me that this was not a good time but that she and her mother are continuing to discuss the next steps. We discussed meeting in the morning to identify what these would be.  Provided my direct phone number should their be any additional questions.   Discussed the importance of continued conversation with family and their  medical providers regarding overall plan of care and treatment options, ensuring decisions are within the context of the patients values and GOCs.  Questions and concerns addressed   Objective Assessment: Vital Signs Vitals:   10/02/20 0400 10/02/20  0753  BP: (!) 104/54 135/62  Pulse: 84 83  Resp: (!) 22 (!) 25  Temp: 98 F (36.7 C) 97.7 F (36.5 C)  SpO2: 97%    No intake or output data in the 24 hours ending 10/02/20 1213 Last Weight  Most recent update: 09/11/2020  4:56 PM   Weight  76.2 kg (167 lb 14.4 oz)           Gen:  Elderly AA F HEENT: Dry mucous membranes CV: Regular rate and rhythm, no murmurs rubs or gallops PULM: HFNC ABD: soft/nontender/nondistended/normal bowel sounds  Neuro: Alert and oriented x4  SUMMARY OF RECOMMENDATIONS Do not attempt resuscitation do not attempt intubation  MOST form will be scanned into [employer]  Patient and her daughter plan to speak this afternoon amongst one another. Palliative team will meet with them in the morning.   Spiritual support - [employer]  Discussed with Dr. Waldron Labs  Time Spent: 35 Greater than 50% of the time was spent in counseling and coordination of care ______________________________________________________________________________________ [place] Team Team Cell Phone: [phone] Please utilize secure chat with additional questions, if there is no response within 30 minutes please call the above phone number  Palliative Medicine Team providers are available by phone from 7am to 7pm daily and can be reached through the team cell phone.  Should this patient require assistance outside of these hours, please call the patient's attending physician.

## 2020-10-03 DIAGNOSIS — Z7189 Other specified counseling: Secondary | ICD-10-CM | POA: Diagnosis not present

## 2020-10-03 DIAGNOSIS — U071 COVID-19: Secondary | ICD-10-CM | POA: Diagnosis not present

## 2020-10-03 DIAGNOSIS — Z515 Encounter for palliative care: Secondary | ICD-10-CM | POA: Diagnosis not present

## 2020-10-03 DIAGNOSIS — J9601 Acute respiratory failure with hypoxia: Secondary | ICD-10-CM | POA: Diagnosis not present

## 2020-10-03 LAB — GLUCOSE, CAPILLARY
Glucose-Capillary: 85 mg/dL (ref 70–99)
Glucose-Capillary: 174 mg/dL — ABNORMAL HIGH (ref 70–99)
Glucose-Capillary: 216 mg/dL — ABNORMAL HIGH (ref 70–99)
Glucose-Capillary: 135 mg/dL — ABNORMAL HIGH (ref 70–99)

## 2020-10-03 NOTE — Plan of Care (Signed)
  Problem: Education: Goal: Knowledge of General Education information will improve Description Including pain rating scale, medication(s)/side effects and non-pharmacologic comfort measures Outcome: Progressing   

## 2020-10-03 NOTE — Progress Notes (Signed)
PROGRESS NOTE  Hannah Torres VQQ:[id number] DOB: [birth year] DOA: 10/04/2020  PCP: Pcp, No  Brief History/Interval Summary:   - 74 y.o. female with medical history significant of hypertension, former smoker presented to emergency department with worsening shortness of breath since 4 days.  She is not vaccinated against COVID-19.  Patient was noted to be hypoxic in the emergency department.  Chest x-ray showed bilateral opacities.  Was treated with steroids, Remdesivir and baricitinib, unfortunately he remains with severe hypoxia, increased work of breathing and dyspnea with significant oxygen requirement, overall extremely poor prognosis, palliative medicine has been consulted.   Reason for Visit: Pneumonia due to COVID-19.  Acute respiratory failure with hypoxia  Consultants: None  Procedures: None  Antibiotics: Anti-infectives (From admission, onward)   Start     Dose/Rate Route Frequency Ordered Stop   09/11/20 1000  remdesivir 100 mg in sodium chloride 0.9 % 100 mL IVPB       "Followed by" Linked Group Details   100 mg 200 mL/hr over 30 Minutes Intravenous Daily 09/29/2020 1815 09/14/20 1134   09/11/2020 2000  remdesivir 200 mg in sodium chloride 0.9% 250 mL IVPB       "Followed by" Linked Group Details   200 mg 580 mL/hr over 30 Minutes Intravenous Once 09/30/2020 1815 09/28/2020 2228   10/01/2020 1530  cefTRIAXone (ROCEPHIN) 2 g in sodium chloride 0.9 % 100 mL IVPB  Status:  Discontinued        2 g 200 mL/hr over 30 Minutes Intravenous Every 24 hours 09/21/2020 1517 09/11/20 0704   09/13/2020 1530  azithromycin (ZITHROMAX) 500 mg in sodium chloride 0.9 % 250 mL IVPB  Status:  Discontinued        500 mg 250 mL/hr over 60 Minutes Intravenous Every 24 hours 10/06/2020 1517 09/11/20 0704      Subjective/Interval History:     Patient was instructed of COVID-19 unit as she finished her 21 days quarantine, she remains significantly dyspneic, with significant increased work of breathing with  minimal activity.  Assessment/Plan:  Acute Hypoxic Resp. Failure/Pneumonia due to COVID-19 + DVT and possible PE  - She is unfortunately not vaccinated for Covid and has incurred severe parenchymal lung injury due to COVID-19 pneumonia,  -she has been treated with IV steroids, she did finish total of 24 days of IV steroids, has been tapered off today.  . -She was treated with Remdesivir  - she was  Treated with  Baricitinib.  - Lasix as needed being used as well, no evidence of volume overload, dictation for Lasix. -Patient unfortunately with severe lung disease from Covid, with a repeat x-ray showing worsening of her bilateral opacity due to COVID-19 pneumonia, respiratory status remains very tenuous, she extremely frail, deconditioned, and no respiratory reserve, at baseline at rest while doing nothing she is requiring 40 L heated high flow and 15 NRB, if she does well activity even eating or taking her medication she is significantly dyspneic requiring up to 70 L heated high flow plus NRB , I have discussed with her is seemingly frail, deconditioned, goes into severe respiratory distress with very minimal activity, even taking his meds or eating, and moving in the bed where she needs to go up to 70 L high flow nasal cannula and NRB together .  She is on as needed morphine for air hunger.  Please see discussion below regarding goals of care. - her chest x-ray 10/21 showing worsening COVID-19 positive test (U07.1, COVID-19) with Acute Pneumonia (J12.89,  Other viral pneumonia) (If respiratory failure or sepsis present, add as separate assessment)  SpO2: 100 % O2 Flow Rate (L/min): (S) 35 L/min FiO2 (%): 100 %    Recent Labs  Lab 09/27/20 0411 09/27/20 0412 09/28/20 0056 09/30/20 0420  WBC  --  20.8* 20.3* 19.3*  CRP  --  <0.5 1.0*  --   DDIMER  --  1.62* 1.65*  --   BNP 87.8  --  66.0  --   AST  --  31 30 28   ALT  --  81* 76* 60*  ALKPHOS  --  89 92 98  BILITOT  --  0.6 0.6 0.6    ALBUMIN  --  2.5* 2.6* 2.7*       Acute DVT bilateral lower extremities/presumed PE  -  Patient found to have a bilateral lower extremity DVT.  Has been on full dose Lovenox will transition her to Eliquis on 09/24/2020 as D-dimer is now trending down, treatment will be 6 months total.  Atrial fibrillation with RVR with 09/26/2020 vas 2 score of greater than 3.  - No previous history of same.  Stable echocardiogram and TSH, was on Cardizem infusion, and 2 doses of IV digoxin, currently appears stable on high-dose oral beta-blocker which will be continued.  Already on full dose anticoagulation due to bilateral lower DVTs and presumed PE.  Transaminitis  - Secondary to COVID-19 and possibly remdesivir use.  She is asymptomatic and has stable RUQ [place] - now stable trend .  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  Lab Results  Component Value Date   TSH 0.183 (L) 09/18/2020    Essential hypertension  - B.Blocker.  Acute kidney injury - caused by ATN from infection, resolved after hydration.  Thrombocytosis/leukocytosis - Likely reactive due to acute infection.  Elevated WBC likely due to steroids.  Platelet counts have improved.  Hyperglycemia - No known history of diabetes.  HbA1c 6.4.  Hyperglycemia likely due to steroids.  Continue low-dose Lantus and SSI.  CBG (last 3)  Recent Labs    10/02/20 1706 10/02/20 2129 10/03/20 0734  GLUCAP 184* 143* 85    Oral thrush/candidiasis -Started on Magic mouthwash.  Goals of care: -Patient having an episodes of significant hypoxia and rapid deterioration with minimal activity, I have discussed with daughter 09/24/2020, about goals of care, and patient at extremely high risk for death and deterioration , did approach goals of care discussion, patient respiratory status very tenuous, he is showing no improvement unfortunately with a very tenuous respiratory status,  palliative medicine has been consulted, discussed with patient about comfort measures, patient still undecided, will continue with current treatment for now .  DVT Prophylaxis: On therapeutic Lovenox Code Status: DNR Family Communication: Daughter Clerance Lav at bedside daily, I have explained for her overall poor prognosis, Disposition Plan: remains unclear  Status is: Inpatient  Remains inpatient appropriate because:IV treatments appropriate due to intensity of illness or inability to take PO and Inpatient level of care appropriate due to severity of illness   Dispo:  Patient From:    Planned Disposition:    Expected discharge date: 10/09/2020  Medically stable for discharge:     Medications:  Scheduled:  apixaban  5 mg Oral BID   vitamin C  500 mg Oral Daily   famotidine  20 mg Oral Daily   feeding supplement  237 mL Oral TID BM   influenza  vaccine adjuvanted  0.5 mL Intramuscular Tomorrow-1000   insulin aspart  0-20 Units Subcutaneous TID WC   insulin aspart  0-5 Units Subcutaneous QHS   insulin glargine  8 Units Subcutaneous QHS   magic mouthwash  5 mL Oral QID   methylPREDNISolone (SOLU-MEDROL) injection  40 mg Intravenous Daily   metoprolol tartrate  50 mg Oral BID   mometasone-formoterol  2 puff Inhalation BID   multivitamin with minerals  1 tablet Oral Daily   pantoprazole  40 mg Oral Daily   zinc sulfate  220 mg Oral Daily   Continuous:  JFH:[id number], chlorpheniramine-HYDROcodone, diltiazem, guaiFENesin-dextromethorphan, lip balm, loperamide, morphine injection, [DISCONTINUED] ondansetron **OR** ondansetron (ZOFRAN) IV, phenol, polyethylene glycol, sodium chloride   Objective:  Vital Signs  Vitals:   10/03/20 0453 10/03/20 0732 10/03/20 0736 10/03/20 0756  BP: 126/72  130/63   Pulse: 93  85 88  Resp: 20  (!) 22 (!) 24  Temp: 98.4 F (36.9 C)  98.2 F (36.8 C)   TempSrc: Oral  Axillary   SpO2: 100% 100%  100%  Weight:      Height:        No intake or output data in the 24 hours ending 10/03/20 1047 Filed Weights   09/27/2020 2150 09/28/2020 2155 09/11/20 1649  Weight: 78.5 kg 79.4 kg 76.2 kg    Exam   Awake, alert, oriented x3, extremely frail, anxious, in mild respiratory distress at rest, with significant dyspnea increased work of breathing with minimal activity.   Scattered rails, fair air entry bilaterally RRR,No Gallops,Rubs or new Murmurs, No Parasternal Heave Positive bowel sounds, abdomen soft No Cyanosis, Clubbing or edema, No new Rash or bruise     Lab Results:  Data Reviewed: I have personally reviewed following labs and imaging studies  Recent Labs  Lab 09/27/20 0412 09/28/20 0056 09/30/20 0420  WBC 20.8* 20.3* 19.3*  HGB 10.4* 11.1* 11.5*  HCT 28.9* 31.2* 32.5*  PLT 224 226 188  MCV 78.3* 78.2* 79.7*  MCH 28.2 27.8 28.2  MCHC 36.0 35.6 35.4  RDW 12.3 12.5 13.1  LYMPHSABS 1.3 0.9  --   MONOABS 0.7 0.4  --   EOSABS 0.2 0.1  --   BASOSABS 0.0 0.0  --     Recent Labs  Lab 09/27/20 0411 09/27/20 0412 09/28/20 0056 09/30/20 0420  NA  --  134* 135 136  K  --  4.6 4.7 4.1  CL  --  97* 97* 100  CO2  --  25 26 25   GLUCOSE  --  160* 138* 97  BUN  --  27* 20 19  CREATININE  --  0.79 0.74 0.69  CALCIUM  --  8.9 9.3 9.0  AST  --  31 30 28   ALT  --  81* 76* 60*  ALKPHOS  --  89 92 98  BILITOT  --  0.6 0.6 0.6  ALBUMIN  --  2.5* 2.6* 2.7*  MG  --  1.9 2.0  --   CRP  --  <0.5 1.0*  --   DDIMER  --  1.62* 1.65*  --   BNP 87.8  --  66.0  --      No results found for this or any previous visit (from the past 240 hour(s)).    Radiology Studies: No results found.    LOS: 23 days   on [email or web address]  10/03/2020, 10:47 AM

## 2020-10-03 NOTE — Progress Notes (Signed)
   10/03/20 0915  Clinical Encounter Type  Visited With Patient and family together  Visit Type Spiritual support  Referral From Nurse  Consult/Referral To Chaplain  Chaplain responded. Daughter at bedside. Patient declines prayer. Chaplain offered emotional support to the patient's daughter. Advised her to have the nurse call us if patient changes her mind. This note was prepared by Deneen Harts, M.Div..  For questions please contact by phone [phone].

## 2020-10-03 NOTE — Progress Notes (Signed)
Called to room, MD at bedside requesting 1 mg of morphine.  Attempted to stand patient up.  Heated High flow at 70L at this time

## 2020-10-03 NOTE — Progress Notes (Signed)
Palliative Medicine Inpatient Follow Up Note   Reason for consult:  Goals of Care "End of Life"  HPI:  Per intake H&P --> 74 y.o.femalewith medical history significant ofhypertension, former smokerpresented to emergency department with worsening shortness of breath since 4 days. She is not vaccinated against COVID-19. Patient was noted to be hypoxic in the emergency department.   Palliative care was asked to aid in goals of care conversations in the setting of severe Covid pneumonia.  Today's Discussion (10/03/2020): Chart reviewed.  Patient noted to be out of Covid isolation.  She remains on high flow nasal cannula and now a nonrebreather.  I met with [place] and her daughter Jinny Sanders at bedside this morning.  Jinny Sanders shares that [place] is really having a difficult time conceptualizing what to do next.  She remains to be helpful for improvement.  Jinny Sanders states that her biggest fear is that her mother struggle, as she states she does see how difficult it is for her mother to breathe when she moves in any which way.  She very reasonably so wants to allow any decisions moving forward to be up to [place].  I encouraged them to keep an open dialogue and shared with him that the palliative medicine team will continue to check in daily to see how she progresses or further declines.  I stated that at any point if [place] feels like she is struggling more that we can certainly change emphasis to make her more comfortable.  I asked if the morphine that had been ordered was helping with her feelings of dyspnea and she shares that it has been.  I encouraged for the patient to have her pastor come in to pray with her if this would be of value in terms of additional decision-making.  Discussed the importance of continued conversation with family and their  medical providers regarding overall plan of care and treatment options, ensuring decisions are within the context of the patients values and  GOCs.  Questions and concerns addressed   Objective Assessment: Vital Signs Vitals:   10/03/20 0736 10/03/20 0756  BP: 130/63   Pulse: 85 88  Resp: (!) 22 (!) 24  Temp: 98.2 F (36.8 C)   SpO2:  100%   No intake or output data in the 24 hours ending 10/03/20 0843 Last Weight  Most recent update: 09/11/2020  4:56 PM   Weight  76.2 kg (167 lb 14.4 oz)           Gen:  Elderly AA F HEENT: Dry mucous membranes CV: Regular rate and rhythm, no murmurs rubs or gallops PULM: HFNC ABD: soft/nontender/nondistended/normal bowel sounds  Neuro: Alert and oriented x4  SUMMARY OF RECOMMENDATIONS Do not attempt resuscitation do not attempt intubation  Patient not ready for comfort focused care.  She remains hopeful for improvements though she shares if she is not improving she would further consider this emphasis.  Spiritual support - [employer]  The palliative medicine team will continue to check in frequently to aid in supporting this patient and her family  Time Spent: 25 Greater than 50% of the time was spent in counseling and coordination of care ______________________________________________________________________________________ [place] Team Team Cell Phone: [phone] Please utilize secure chat with additional questions, if there is no response within 30 minutes please call the above phone number  Palliative Medicine Team providers are available by phone from 7am to 7pm daily and can be reached through the team cell phone.  Should  this patient require assistance outside of these hours, please call the patient's attending physician.

## 2020-10-04 DIAGNOSIS — U071 COVID-19: Secondary | ICD-10-CM | POA: Diagnosis not present

## 2020-10-04 DIAGNOSIS — J9601 Acute respiratory failure with hypoxia: Secondary | ICD-10-CM | POA: Diagnosis not present

## 2020-10-04 DIAGNOSIS — Z66 Do not resuscitate: Secondary | ICD-10-CM | POA: Diagnosis not present

## 2020-10-04 LAB — BASIC METABOLIC PANEL
Anion gap: 10 (ref 5–15)
BUN: 17 mg/dL (ref 8–23)
CO2: 25 mmol/L (ref 22–32)
Calcium: 9.1 mg/dL (ref 8.9–10.3)
Chloride: 102 mmol/L (ref 98–111)
Creatinine, Ser: 0.69 mg/dL (ref 0.44–1.00)
GFR, Estimated: >60 mL/min (ref >60)
Glucose, Bld: 91 mg/dL (ref 70–99)
Potassium: 4.3 mmol/L (ref 3.5–5.1)
Sodium: 137 mmol/L (ref 135–145)

## 2020-10-04 LAB — CBC
HCT: 28.4 % — ABNORMAL LOW (ref 36.0–46.0)
Hemoglobin: 10 g/dL — ABNORMAL LOW (ref 12.0–15.0)
MCH: 28.3 pg (ref 26.0–34.0)
MCHC: 35.2 g/dL (ref 30.0–36.0)
MCV: 80.5 fL (ref 80.0–100.0)
Platelets: 211 10*3/uL (ref 150–400)
RBC: 3.53 MIL/uL — ABNORMAL LOW (ref 3.87–5.11)
RDW: 13.5 % (ref 11.5–15.5)
WBC: 16.1 10*3/uL — ABNORMAL HIGH (ref 4.0–10.5)
nRBC: 0 % (ref 0.0–0.2)

## 2020-10-04 LAB — GLUCOSE, CAPILLARY
Glucose-Capillary: 108 mg/dL — ABNORMAL HIGH (ref 70–99)
Glucose-Capillary: 145 mg/dL — ABNORMAL HIGH (ref 70–99)
Glucose-Capillary: 115 mg/dL — ABNORMAL HIGH (ref 70–99)
Glucose-Capillary: 157 mg/dL — ABNORMAL HIGH (ref 70–99)

## 2020-10-04 MED ORDER — INSULIN GLARGINE 100 UNIT/ML ~~LOC~~ SOLN
5.0000 [IU] | Freq: Every day | SUBCUTANEOUS | Status: DC
  Administered 2020-10-04: 5 [IU] via SUBCUTANEOUS
  Filled 2020-10-04 (×2): qty 0.05

## 2020-10-04 NOTE — Plan of Care (Signed)
Medicated x 1 for mild discomfort and SOB. Daughter remains at bedside. Safety precautions maintained.

## 2020-10-04 NOTE — Progress Notes (Signed)
Occupational Therapy Treatment Patient Details Name: Hannah Torres MRN: [id number] DOB: Nov 01, 1946 Today's Date: 10/04/2020    History of present illness Pt is a 74 y.o. female admitted September 23, 2020 with worsening SOB. Workup for acute hypoxic respiratory failure secondary to COVID-19 PNA. Found to have BLE DVT and presumed PE. PMH includes HTN, tobacco use.   OT comments  Pt in fowlers position in bed with daughter at bedside, RR 26-30. Pt declining mobility this session and appears to have increased anxiousness with anticipated mobility, RR 38-40 at rest when discussing mobility, SpO2 100% HFNC and NRB 35L/min. Educated pt on importance of activity progression, mobility of BUE and BLE in bed, postural/positional changes in bed. Reviewed role of OT and pt verbalized her goals was to get out of bed and be able to wash herself. Pt agreeable to mobility tomorrow. OT will continue to follow acutely.   Follow Up Recommendations  Home health OT;Supervision/Assistance - 24 hour    Equipment Recommendations  3 in 1 bedside commode    Recommendations for Other Services      Precautions / Restrictions Precautions Precautions: Fall;Other (comment) Precaution Comments: Anxious regarding SOB HFNC and NRB Restrictions Weight Bearing Restrictions: No       Mobility Bed Mobility               General bed mobility comments: pt declined mobility this session  Transfers                 General transfer comment: pt declined    Balance                                           ADL either performed or assessed with clinical judgement   ADL Overall ADL's : Needs assistance/impaired Eating/Feeding: Set up Eating/Feeding Details (indicate cue type and reason): reports incrased pain and fatigue with eating Grooming: Minimal assistance Grooming Details (indicate cue type and reason): minA at times due to decreased activity tolerance/endurance                                General ADL Comments: pt declined OOB mobility this session, agreeable to education on educated regarding pain management/stress management strategies, educated pt on importance of sitting upright to begin to increase sitting tolerance, pt appears anxious with discussion of mobility     Vision       Perception     Praxis      Cognition Arousal/Alertness: Awake/alert Behavior During Therapy: WFL for tasks assessed/performed Overall Cognitive Status: Impaired/Different from baseline Area of Impairment: Attention;Awareness                   Current Attention Level: Selective Memory: Decreased short-term memory     Awareness: Emergent   General Comments: pt with flat affect during session, appears anxious with mobility, discussed use of progressive muscle relaxation videos for pain mangement, stress management, and sleep quality, educated pt on benefit of engaging in videos and resources to listen to them        Exercises Other Exercises Other Exercises: educated on Progressive Muscle Relaxation (PMR) for pain management, stress management and sleep quality Other Exercises: educated pt on importance of mobility progression and increasing activity tolerance consistently, especially when not working with therpy, educated pt and daughter on importance of positional changes  in bed, postural changes/putting bed in chair position and mobilizing BUE and BLE 3x daily   Shoulder Instructions       General Comments RR 38-40 at rest, appeared to increase when discussing mobility. Pt declined this date, agreeable to attempt tomorrow.     Pertinent Vitals/ Pain       Pain Assessment: No/denies pain  Home Living                                          Prior Functioning/Environment              Frequency  Min 2X/week        Progress Toward Goals  OT Goals(current goals can now be found in the care plan section)  Progress  towards OT goals: Progressing toward goals  Acute Rehab OT Goals Patient Stated Goal: To get better and return home to see grandson OT Goal Formulation: With patient Time For Goal Achievement: 10/13/20 Potential to Achieve Goals: Good ADL Goals Pt Will Perform Lower Body Bathing: with supervision;sit to/from stand Pt Will Perform Lower Body Dressing: with supervision;sit to/from stand Pt Will Transfer to Toilet: with supervision;ambulating Pt Will Perform Toileting - Clothing Manipulation and hygiene: with modified independence;sit to/from stand Pt/caregiver will Perform Home Exercise Program: Both right and left upper extremity;With written HEP provided;With Supervision;Increased strength Additional ADL Goal #1: Pt will verbalize 3 energy conservation strategies independently Additional ADL Goal #2: Pt will independently verbalize 2 strategeis to reduce risk of falls  Plan Discharge plan needs to be updated    Co-evaluation                 AM-PAC OT "6 Clicks" Daily Activity     Outcome Measure   Help from another person eating meals?: None Help from another person taking care of personal grooming?: A Little Help from another person toileting, which includes using toliet, bedpan, or urinal?: A Little Help from another person bathing (including washing, rinsing, drying)?: A Lot Help from another person to put on and taking off regular upper body clothing?: A Little Help from another person to put on and taking off regular lower body clothing?: A Lot 6 Click Score: 17    End of Session Equipment Utilized During Treatment: Oxygen (15L HFNC; 15L NRB at times)  OT Visit Diagnosis: Unsteadiness on feet (R26.81);Muscle weakness (generalized) (M62.81);Other symptoms and signs involving cognitive function   Activity Tolerance Patient limited by fatigue   Patient Left with call bell/phone within reach;in bed;with family/visitor present   Nurse Communication Mobility status         Time: 7371-0626 OT Time Calculation (min): 12 min  Charges: OT General Charges $OT Visit: 1 Visit OT Treatments $Self Care/Home Management : 8-22 mins  Rosey Bath OTR/L Acute Rehabilitation Services Office: [phone]    Rebeca Alert 10/04/2020, 2:07 PM

## 2020-10-04 NOTE — Progress Notes (Signed)
PROGRESS NOTE  Hannah Torres KZS:[id number] DOB: 03-04-1946 DOA: September 20, 2020  PCP: Pcp, No  Brief History/Interval Summary:   - 74 y.o. female with medical history significant of hypertension, former smoker presented to emergency department with worsening shortness of breath since 4 days.  She is not vaccinated against COVID-19.  Patient was noted to be hypoxic in the emergency department.  Chest x-ray showed bilateral opacities.  Was treated with steroids, Remdesivir and baricitinib, unfortunately he remains with severe hypoxia, increased work of breathing and dyspnea with significant oxygen requirement, overall extremely poor prognosis, palliative medicine has been consulted.   Reason for Visit: Pneumonia due to COVID-19.  Acute respiratory failure with hypoxia  Consultants: Pallative  Procedures: None  Antibiotics: Anti-infectives (From admission, onward)   Start     Dose/Rate Route Frequency Ordered Stop   09/11/20 1000  remdesivir 100 mg in sodium chloride 0.9 % 100 mL IVPB       "Followed by" Linked Group Details   100 mg 200 mL/hr over 30 Minutes Intravenous Daily 09/20/20 1815 09/14/20 1134   09/20/20 2000  remdesivir 200 mg in sodium chloride 0.9% 250 mL IVPB       "Followed by" Linked Group Details   200 mg 580 mL/hr over 30 Minutes Intravenous Once 2020-09-20 1815 09-20-2020 2228   09-20-20 1530  cefTRIAXone (ROCEPHIN) 2 g in sodium chloride 0.9 % 100 mL IVPB  Status:  Discontinued        2 g 200 mL/hr over 30 Minutes Intravenous Every 24 hours 2020-09-20 1517 09/11/20 0704   2020-09-20 1530  azithromycin (ZITHROMAX) 500 mg in sodium chloride 0.9 % 250 mL IVPB  Status:  Discontinued        500 mg 250 mL/hr over 60 Minutes Intravenous Every 24 hours 20-Sep-2020 1517 09/11/20 0704      Subjective/Interval History:     Patient significantly dyspneic, and this much worsened with minimal activity, he is so dyspneic at this point she is only able to take her tablets one at a time,  she is unable to tolerate any oral intake and her profound dyspnea once taken off her mask .  Assessment/Plan:  Acute Hypoxic Resp. Failure/Pneumonia due to COVID-19 + DVT and possible PE  - She is unfortunately not vaccinated for Covid and has incurred severe parenchymal lung injury due to COVID-19 pneumonia,  -she has been treated with IV steroids, she did finish total of 24 days of IV steroids, has been tapered off today.  . -She was treated with Remdesivir  - she was  Treated with  Baricitinib.  - Lasix as needed being used as well, no evidence of volume overload, dictation for Lasix. -Patient unfortunately with severe lung disease from Covid, with a repeat x-ray showing worsening of her bilateral opacity due to COVID-19 pneumonia, respiratory status remains very tenuous, she extremely frail, deconditioned, and no respiratory reserve, is with quite oxygen requirement at rest without any movement requiring 35 L heated high flow and NRB, with minimal activity ,even  taking here pills she becomes significantly dyspneic, with significant oxygen requirement, she is unable to tolerate any eating at this point given her profound dyspnea, I have discussed at length with the patient and daughter, they both understand overall very poor prognosis, and unlikely patient will survive this illness palliative medicine is following, she is on as needed morphine, have informed him to requested if she is dyspneic. - her chest x-ray 10/21 showing worsening COVID-19 positive test (U07.1, COVID-19) with  Acute Pneumonia (J12.89, Other viral pneumonia) (If respiratory failure or sepsis present, add as separate assessment)  SpO2: 100 % O2 Flow Rate (L/min): 35 L/min FiO2 (%): 90 %    Recent Labs  Lab 09/28/20 0056 09/30/20 0420 10/04/20 0315  WBC 20.3* 19.3* 16.1*  CRP 1.0*  --   --   DDIMER 1.65*  --   --   BNP 66.0  --   --   AST 30 28  --   ALT 76* 60*  --   ALKPHOS 92 98  --   BILITOT 0.6 0.6  --     ALBUMIN 2.6* 2.7*  --        Acute DVT bilateral lower extremities/presumed PE  -  Patient found to have a bilateral lower extremity DVT.  Has been on full dose Lovenox will transition her to Eliquis on 09/24/2020 as D-dimer is now trending down, treatment will be 6 months total.  Atrial fibrillation with RVR with [place] vas 2 score of greater than 3.  - No previous history of same.  Stable echocardiogram and TSH, was on Cardizem infusion, and 2 doses of IV digoxin, currently appears stable on high-dose oral beta-blocker which will be continued.  Already on full dose anticoagulation due to bilateral lower DVTs and presumed PE.  Transaminitis  - Secondary to COVID-19 and possibly remdesivir use.  She is asymptomatic and has stable RUQ [place] - now stable trend .  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  Lab Results  Component Value Date   TSH 0.183 (L) 09/18/2020    Essential hypertension  - B.Blocker.  Acute kidney injury - caused by ATN from infection, resolved after hydration.  Thrombocytosis/leukocytosis - Likely reactive due to acute infection.  Elevated WBC likely due to steroids.  Platelet counts have improved.  Hyperglycemia - No known history of diabetes.  HbA1c 6.4.  Hyperglycemia likely due to steroids.  Continue low-dose Lantus and SSI.  CBG (last 3)  Recent Labs    10/03/20 1544 10/03/20 2055 10/04/20 0828  GLUCAP 216* 135* 108*    Oral thrush/candidiasis -Started on Magic mouthwash.  Goals of care: -Patient having an episodes of significant hypoxia and rapid deterioration with minimal activity, I have discussed with daughter Clerance Lav, about goals of care, and patient at extremely high risk for death and deterioration , did approach goals of care discussion, patient respiratory status very tenuous, he is showing no improvement unfortunately with a very tenuous respiratory status, palliative  medicine has been consulted, discussed with patient about comfort measures, patient still undecided, will continue with current treatment for now .  DVT Prophylaxis: On therapeutic Lovenoxhom Code Status: DNR Family Communication: Daughter Armando Reichert at bedside daily, I have explained for her overall poor prognosis, Disposition Plan: remains unclear  Status is: Inpatient  Remains inpatient appropriate because:IV treatments appropriate due to intensity of illness or inability to take PO and Inpatient level of care appropriate due to severity of illness   Dispo:  Patient From:  home  Planned Disposition:  no clear plan yet  Expected discharge date: 10/09/2020  Medically stable for discharge:  NO   Medications:  Scheduled: . apixaban  5 mg Oral BID  . vitamin C  500 mg Oral Daily  . famotidine  20 mg Oral Daily  . feeding supplement  237 mL Oral TID BM  . influenza vaccine adjuvanted  0.5 mL Intramuscular Tomorrow-1000  .  insulin aspart  0-20 Units Subcutaneous TID WC  . insulin aspart  0-5 Units Subcutaneous QHS  . insulin glargine  8 Units Subcutaneous QHS  . magic mouthwash  5 mL Oral QID  . metoprolol tartrate  50 mg Oral BID  . mometasone-formoterol  2 puff Inhalation BID  . multivitamin with minerals  1 tablet Oral Daily  . pantoprazole  40 mg Oral Daily  . zinc sulfate  220 mg Oral Daily   Continuous:  VAP:OLIDCVUDTHYHO, chlorpheniramine-HYDROcodone, diltiazem, guaiFENesin-dextromethorphan, lip balm, loperamide, morphine injection, [DISCONTINUED] ondansetron **OR** ondansetron (ZOFRAN) IV, phenol, polyethylene glycol, sodium chloride   Objective:  Vital Signs  Vitals:   10/04/20 0141 10/04/20 0313 10/04/20 0830 10/04/20 0833  BP: 113/63 114/65    Pulse: 78 68 87   Resp: (!) 25 18    Temp:  97.8 F (36.6 C) 98.3 F (36.8 C)   TempSrc:  Oral Axillary   SpO2: 100% 100% 100% 100%  Weight:      Height:       No intake or output data in the 24 hours ending 10/04/20  1138 Filed Weights   09/14/2020 2150 10/01/2020 2155 09/11/20 1649  Weight: 78.5 kg 79.4 kg 76.2 kg    Exam   Awake Alert, Oriented X 3, extremely frail, anxious, in mild respiratory distress  Diminished air entry, in mild respiratory distress at rest, significantly dyspneic with increased work of breathing with minimal activity  RRR,No Gallops,Rubs or new Murmurs, No Parasternal Heave +ve B.Sounds, Abd Soft, No tenderness, No rebound - guarding or rigidity. No Cyanosis, Clubbing or edema, No new Rash or bruise    Lab Results:  Data Reviewed: I have personally reviewed following labs and imaging studies  Recent Labs  Lab 09/28/20 0056 09/30/20 0420 10/04/20 0315  WBC 20.3* 19.3* 16.1*  HGB 11.1* 11.5* 10.0*  HCT 31.2* 32.5* 28.4*  PLT 226 188 211  MCV 78.2* 79.7* 80.5  MCH 27.8 28.2 28.3  MCHC 35.6 35.4 35.2  RDW 12.5 13.1 13.5  LYMPHSABS 0.9  --   --   MONOABS 0.4  --   --   EOSABS 0.1  --   --   BASOSABS 0.0  --   --     Recent Labs  Lab 09/28/20 0056 09/30/20 0420 10/04/20 0315  NA 135 136 137  K 4.7 4.1 4.3  CL 97* 100 102  CO2 26 25 25   GLUCOSE 138* 97 91  BUN 20 19 17   CREATININE 0.74 0.69 0.69  CALCIUM 9.3 9.0 9.1  AST 30 28  --   ALT 76* 60*  --   ALKPHOS 92 98  --   BILITOT 0.6 0.6  --   ALBUMIN 2.6* 2.7*  --   MG 2.0  --   --   CRP 1.0*  --   --   DDIMER 1.65*  --   --   BNP 66.0  --   --      No results found for this or any previous visit (from the past 240 hour(s)).    Radiology Studies: No results found.    LOS: 24 days   Trejuan Matherne  [facility] on [email or web address]  10/04/2020, 11:38 AM

## 2020-10-05 ENCOUNTER — Inpatient Hospital Stay (HOSPITAL_COMMUNITY): Payer: Medicare PPO

## 2020-10-05 DIAGNOSIS — Z515 Encounter for palliative care: Secondary | ICD-10-CM | POA: Diagnosis not present

## 2020-10-05 DIAGNOSIS — U071 COVID-19: Secondary | ICD-10-CM | POA: Diagnosis not present

## 2020-10-05 DIAGNOSIS — Z7189 Other specified counseling: Secondary | ICD-10-CM | POA: Diagnosis not present

## 2020-10-05 DIAGNOSIS — Z66 Do not resuscitate: Secondary | ICD-10-CM | POA: Diagnosis not present

## 2020-10-05 LAB — POCT I-STAT 7, (LYTES, BLD GAS, ICA,H+H)
Acid-base deficit: 4 mmol/L — ABNORMAL HIGH (ref 0.0–2.0)
Bicarbonate: 22 mmol/L (ref 20.0–28.0)
Calcium, Ion: 1.17 mmol/L (ref 1.15–1.40)
HCT: 31 % — ABNORMAL LOW (ref 36.0–46.0)
Hemoglobin: 10.5 g/dL — ABNORMAL LOW (ref 12.0–15.0)
O2 Saturation: 56 %
Patient temperature: 98.3
Potassium: 5.3 mmol/L — ABNORMAL HIGH (ref 3.5–5.1)
Sodium: 135 mmol/L (ref 135–145)
TCO2: 23 mmol/L (ref 22–32)
pCO2 arterial: 42.3 mmHg (ref 32.0–48.0)
pH, Arterial: 7.323 — ABNORMAL LOW (ref 7.350–7.450)
pO2, Arterial: 32 mmHg — CL (ref 83.0–108.0)

## 2020-10-05 LAB — GLUCOSE, CAPILLARY
Glucose-Capillary: 260 mg/dL — ABNORMAL HIGH (ref 70–99)
Glucose-Capillary: 203 mg/dL — ABNORMAL HIGH (ref 70–99)
Glucose-Capillary: 223 mg/dL — ABNORMAL HIGH (ref 70–99)

## 2020-10-05 LAB — COMPREHENSIVE METABOLIC PANEL WITH GFR
ALT: 46 U/L — ABNORMAL HIGH (ref 0–44)
AST: 49 U/L — ABNORMAL HIGH (ref 15–41)
Albumin: 2.7 g/dL — ABNORMAL LOW (ref 3.5–5.0)
Alkaline Phosphatase: 130 U/L — ABNORMAL HIGH (ref 38–126)
Anion gap: 15 (ref 5–15)
BUN: 18 mg/dL (ref 8–23)
CO2: 21 mmol/L — ABNORMAL LOW (ref 22–32)
Calcium: 8.7 mg/dL — ABNORMAL LOW (ref 8.9–10.3)
Chloride: 99 mmol/L (ref 98–111)
Creatinine, Ser: 1.38 mg/dL — ABNORMAL HIGH (ref 0.44–1.00)
GFR, Estimated: 40 mL/min — ABNORMAL LOW
Glucose, Bld: 255 mg/dL — ABNORMAL HIGH (ref 70–99)
Potassium: 5.5 mmol/L — ABNORMAL HIGH (ref 3.5–5.1)
Sodium: 135 mmol/L (ref 135–145)
Total Bilirubin: 1.2 mg/dL (ref 0.3–1.2)
Total Protein: 6.4 g/dL — ABNORMAL LOW (ref 6.5–8.1)

## 2020-10-05 LAB — TROPONIN I (HIGH SENSITIVITY): Troponin I (High Sensitivity): 23 ng/L — ABNORMAL HIGH (ref <18)

## 2020-10-05 LAB — BLOOD GAS, ARTERIAL
Acid-Base Excess: 0.5 mmol/L (ref 0.0–2.0)
Bicarbonate: 25.2 mmol/L (ref 20.0–28.0)
Drawn by: 600571
FIO2: 100
O2 Saturation: 59.4 %
Patient temperature: 37
pCO2 arterial: 45.8 mmHg (ref 32.0–48.0)
pH, Arterial: 7.361 (ref 7.350–7.450)
pO2, Arterial: 35.3 mmHg — CL (ref 83.0–108.0)

## 2020-10-05 LAB — LACTIC ACID, PLASMA
Lactic Acid, Venous: 3.5 mmol/L (ref 0.5–1.9)
Lactic Acid, Venous: 7.2 mmol/L (ref 0.5–1.9)

## 2020-10-05 LAB — D-DIMER, QUANTITATIVE: D-Dimer, Quant: >20 ug{FEU}/mL — ABNORMAL HIGH (ref 0.00–0.50)

## 2020-10-05 LAB — CBC
HCT: 32.8 % — ABNORMAL LOW (ref 36.0–46.0)
Hemoglobin: 11 g/dL — ABNORMAL LOW (ref 12.0–15.0)
MCH: 28.2 pg (ref 26.0–34.0)
MCHC: 33.5 g/dL (ref 30.0–36.0)
MCV: 84.1 fL (ref 80.0–100.0)
Platelets: 271 K/uL (ref 150–400)
RBC: 3.9 MIL/uL (ref 3.87–5.11)
RDW: 14.2 % (ref 11.5–15.5)
WBC: 18.1 K/uL — ABNORMAL HIGH (ref 4.0–10.5)
nRBC: 0 % (ref 0.0–0.2)

## 2020-10-05 LAB — BRAIN NATRIURETIC PEPTIDE: B Natriuretic Peptide: 64.9 pg/mL (ref 0.0–100.0)

## 2020-10-05 MED ORDER — FENTANYL CITRATE (PF) 100 MCG/2ML IJ SOLN: 100.0000 ug | Freq: Once | INTRAMUSCULAR | Status: DC

## 2020-10-05 MED ORDER — FENTANYL CITRATE (PF) 100 MCG/2ML IJ SOLN
INTRAMUSCULAR | Status: AC
  Filled 2020-10-05: qty 2

## 2020-10-05 MED ORDER — CISATRACURIUM BOLUS VIA INFUSION
5.0000 mg | Freq: Once | INTRAVENOUS | Status: AC
  Administered 2020-10-05: 5 mg via INTRAVENOUS
  Filled 2020-10-05: qty 5

## 2020-10-05 MED ORDER — FUROSEMIDE 10 MG/ML IJ SOLN
INTRAMUSCULAR | Status: AC
  Filled 2020-10-05: qty 2

## 2020-10-05 MED ORDER — MIDAZOLAM HCL 2 MG/2ML IJ SOLN
2.0000 mg | Freq: Once | INTRAMUSCULAR | Status: AC
  Filled 2020-10-05: qty 2

## 2020-10-05 MED ORDER — ARTIFICIAL TEARS OPHTHALMIC OINT
1.0000 "application " | TOPICAL_OINTMENT | Freq: Three times a day (TID) | OPHTHALMIC | Status: DC
  Administered 2020-10-05: 1 via OPHTHALMIC
  Filled 2020-10-05: qty 3.5

## 2020-10-05 MED ORDER — FENTANYL 2500MCG IN NS 250ML (10MCG/ML) PREMIX INFUSION
0.0000 ug/h | INTRAVENOUS | Status: DC
  Administered 2020-10-05: 120 ug/h via INTRAVENOUS
  Filled 2020-10-05: qty 250

## 2020-10-05 MED ORDER — INSULIN ASPART 100 UNIT/ML ~~LOC~~ SOLN: 0.0000 [IU] | SUBCUTANEOUS | Status: DC

## 2020-10-05 MED ORDER — FENTANYL BOLUS VIA INFUSION
25.0000 ug | INTRAVENOUS | Status: DC | PRN
  Filled 2020-10-05: qty 25

## 2020-10-05 MED ORDER — ETOMIDATE 2 MG/ML IV SOLN: 20.0000 mg | Freq: Once | INTRAVENOUS | Status: AC

## 2020-10-05 MED ORDER — FENTANYL 2500MCG IN NS 250ML (10MCG/ML) PREMIX INFUSION
25.0000 ug/h | INTRAVENOUS | Status: DC
  Administered 2020-10-05 (×2): 300 ug/h via INTRAVENOUS

## 2020-10-05 MED ORDER — MIDAZOLAM 50MG/50ML (1MG/ML) PREMIX INFUSION
2.0000 mg/h | INTRAVENOUS | Status: DC
  Administered 2020-10-05 (×2): 2 mg/h via INTRAVENOUS
  Filled 2020-10-05: qty 50

## 2020-10-05 MED ORDER — CHLORHEXIDINE GLUCONATE 0.12% ORAL RINSE (MEDLINE KIT): 15.0000 mL | Freq: Two times a day (BID) | OROMUCOSAL | Status: DC

## 2020-10-05 MED ORDER — FUROSEMIDE 10 MG/ML IJ SOLN
20.0000 mg | Freq: Once | INTRAMUSCULAR | Status: AC
  Administered 2020-10-05: 20 mg via INTRAVENOUS

## 2020-10-05 MED ORDER — CHLORHEXIDINE GLUCONATE CLOTH 2 % EX PADS
6.0000 | MEDICATED_PAD | Freq: Every day | CUTANEOUS | Status: DC
  Administered 2020-10-05: 6 via TOPICAL

## 2020-10-05 MED ORDER — PANTOPRAZOLE SODIUM 40 MG IV SOLR: 40.0000 mg | INTRAVENOUS | Status: DC

## 2020-10-05 MED ORDER — CISATRACURIUM BOLUS VIA INFUSION
5.0000 mg | Freq: Once | INTRAVENOUS | Status: DC
  Filled 2020-10-05: qty 5

## 2020-10-05 MED ORDER — MIDAZOLAM BOLUS VIA INFUSION
1.0000 mg | INTRAVENOUS | Status: DC | PRN
  Filled 2020-10-05: qty 2

## 2020-10-05 MED ORDER — SODIUM CHLORIDE 0.9 % IV SOLN
0.0000 ug/kg/min | INTRAVENOUS | Status: DC
  Administered 2020-10-05: 3 ug/kg/min via INTRAVENOUS
  Filled 2020-10-05: qty 20

## 2020-10-05 MED ORDER — ARTIFICIAL TEARS OPHTHALMIC OINT: 1.0000 "application " | TOPICAL_OINTMENT | Freq: Three times a day (TID) | OPHTHALMIC | Status: DC

## 2020-10-05 MED ORDER — MIDAZOLAM HCL 2 MG/2ML IJ SOLN
INTRAMUSCULAR | Status: AC
  Filled 2020-10-05: qty 2

## 2020-10-05 MED ORDER — ORAL CARE MOUTH RINSE: 15.0000 mL | OROMUCOSAL | Status: DC

## 2020-10-05 MED ORDER — ENOXAPARIN SODIUM 80 MG/0.8ML ~~LOC~~ SOLN
1.0000 mg/kg | Freq: Two times a day (BID) | SUBCUTANEOUS | Status: DC
  Filled 2020-10-05 (×2): qty 0.75

## 2020-10-05 MED ORDER — FENTANYL CITRATE (PF) 100 MCG/2ML IJ SOLN
25.0000 ug | Freq: Once | INTRAMUSCULAR | Status: AC
  Administered 2020-10-05: 25 ug via INTRAVENOUS
  Filled 2020-10-05: qty 2

## 2020-10-05 MED ORDER — SODIUM CHLORIDE 0.9 % IV SOLN
0.0000 ug/kg/min | INTRAVENOUS | Status: DC
  Filled 2020-10-05: qty 20

## 2020-10-11 NOTE — Progress Notes (Signed)
Pt's daughter called out saying that the pt was in respiratory distress after being placed on bed pan. Pt was desaturating into the low 50- 60's upon arrival to the room. Respiratory, rapid response, and the MD were paged. Pt's oxygen was increased to 100% and placed on Bi-Pap with no improvement. We continued to monitor the pt with no improvement so critical care decided it was best to intubate the pt, which both the pt and her daughter both agreed upon. Pt was transferred to ICU bed 19 after report was given.

## 2020-10-11 NOTE — Progress Notes (Signed)
PT Cancellation Note  Patient Details Name: Hannah Torres MRN: [id number] DOB: January 24, 1946   Cancelled Treatment:    Reason Eval/Treat Not Completed: Medical issues which prohibited therapy (pt with respiratory decline and intubation this am)   Briseidy Spark B [name] 10-27-2020, 6:43 AM  Merryl Hacker, PT Acute Rehabilitation Services Pager: [phone] Office: [phone]

## 2020-10-11 NOTE — Progress Notes (Signed)
Patient after intubation had oxygen saturations in 87% range but has slowly drifted back down into the 60s%.  No where to go with vent.  Will start nimbex.  Prognosis is grim.  Discussed with the daughter and explained at this point if she goes into cardiac arrest it will be because we cannot oxygenate her and that because of this further heroic measures would be futile.  She requests no attempts at cardioversion, cpr or ACLS drugs.  I will follow her wishes.

## 2020-10-11 NOTE — Therapy (Signed)
Pt family called out saying that she was in distress after being placed on bed pan. Pt was desaturating in to the low to mid 60's. Pt oxygen was increased to 100% and her liters increased to 45 with no improvement. Rapid response nurse called and pt placed on bipap see flowsheet will mild improvement. Will continue to monitor.

## 2020-10-11 NOTE — Consult Note (Signed)
NAME:  Hannah Torres, MRN:  [id number], DOB:  March 15, 1946, LOS: 25 ADMISSION DATE:  10/07/2020, CONSULTATION DATE: 09/05/2020 REFERRING MD: Dr. Toniann Fail , CHIEF COMPLAINT: Respiratory failure secondary to COVID-19  Brief History   74 year old hospitalized for almost a month with COVID-19 here [facility] with declining oxygen saturations requiring intubation.  History of present illness   Patient is a 74 year old admitted to the hospital on 09/11/2020 with a diagnosis of Covid pneumonia.  She was on high flow nasal cannula throughout.  She initially received Solu-Medrol remdesivir Lovenox which was then changed to IV heparin due to a period of atrial fibrillation and then Eliquis and baricitinib for 10 days.  Patient has completed Solu-Medrol.  Her oxygen requirements have slowly increased particularly over the last 48 to 72 hours being on 35 L high flow at 90 to 100%.  This evening her oxygen saturations started to decline.  On my arrival to the bedside the patient's O2 saturations were in the high 40s to low 50s.  She was urgently intubated.  Subsequently transferred to the ICU.  Chest x-ray done earlier tonight prior to intubation showed an increased interstitial markings bilaterally with increased alveolar filling in the lower lobes.  Post intubation chest x-ray is pending. Patient was apparently a DO NOT RESUSCITATE patient until she started in her decline earlier this evening.  She rescinded her DNR status and on my arrival the patient clearly desired to be intubated. Patient is on Eliquis for recurrent atrial fibrillation PE seems unlikely although she did have a previous DVT by ultrasound 09/12/2020 posterior tibial veins, and left peroneal vein.  Past Medical History   . HTN (hypertension)   . Sickle cell trait (HCC)   . Tobacco abuse      Significant Hospital Events   Intubation and transfer to the ICU 10-20-20  Consults:  PCCM  Procedures:  Intubation  10-20-20  Significant Diagnostic Tests:  As per HPI  Micro Data:  Covid positive  Antimicrobials:  NA  Interim history/subjective:  NA  Objective   Blood pressure (!) 112/54, pulse (!) 145, temperature 98.2 F (36.8 C), temperature source Axillary, resp. rate (!) 47, height 5\' 5"  (1.651 m), weight 76.2 kg, SpO2 (!) 70 %.    FiO2 (%):  [80 %-90 %] 80 %   Intake/Output Summary (Last 24 hours) at 2020/10/20 0358 Last data filed at 10/04/2020 2027 Gross per 24 hour  Intake --  Output 900 ml  Net -900 ml   Filed Weights   09/25/2020 2150 09/28/2020 2155 09/11/20 1649  Weight: 78.5 kg 79.4 kg 76.2 kg    Examination: General: Prior to intubation the patient is awake alert interactive profoundly dyspneic respiratory rate of 45 HENT: Within normal limits Lungs: Diminished bilateral breath sounds Cardiovascular: Tachycardic rate and rhythm Abdomen: Soft benign Extremities: Within normal limits Neuro: Initially awake alert nonfocal GU: N/A  Resolved Hospital Problem list   NA  Assessment & Plan:  1.  Covid pneumonia: Patient has received all appropriate therapy.  Progressive decline in oxygenation this evening.  Patient intubated transferred to ICU.  Will be kept sedated on fentanyl Nimbex drip if necessary in order to maintain patient ventilator synchronicity.  2.  History of DVT currently on 5 mg Eliquis twice daily.  Will convert to full dose Lovenox.  D-dimer pending.  3.  History of atrial fibrillation in sinus rhythm now we will continue to monitor  4.  Hyperglycemia: will continue sliding scale insulin.  Patient's prognosis is  grim.  Was discussed with both patient and her daughter at bedside.  They voiced understanding but wished to proceed with intubation.  Best practice:  Diet: N.p.o. Pain/Anxiety/Delirium protocol (if indicated): Fentanyl VAP protocol (if indicated): Yes DVT prophylaxis: Continue Eliquis GI prophylaxis: Pepcid Glucose control: On sliding  scale insulin Mobility: Bedrest Code Status: Full code Family Communication: With daughter at bedside Disposition: ICU for further care  Labs   CBC: Recent Labs  Lab 09/30/20 0420 10/04/20 0315  WBC 19.3* 16.1*  HGB 11.5* 10.0*  HCT 32.5* 28.4*  MCV 79.7* 80.5  PLT 188 211    Basic Metabolic Panel: Recent Labs  Lab 09/30/20 0420 10/04/20 0315  NA 136 137  K 4.1 4.3  CL 100 102  CO2 25 25  GLUCOSE 97 91  BUN 19 17  CREATININE 0.69 0.69  CALCIUM 9.0 9.1   GFR: Estimated Creatinine Clearance: 64 mL/min (by C-G formula based on SCr of 0.69 mg/dL). Recent Labs  Lab 09/30/20 0420 10/04/20 0315  WBC 19.3* 16.1*    Liver Function Tests: Recent Labs  Lab 09/30/20 0420  AST 28  ALT 60*  ALKPHOS 98  BILITOT 0.6  PROT 6.2*  ALBUMIN 2.7*   No results for input(s): LIPASE, AMYLASE in the last 168 hours. No results for input(s): AMMONIA in the last 168 hours.  ABG    Component Value Date/Time   PHART 7.361 09/24/2020 0300   PCO2ART 45.8 09/13/2020 0300   PO2ART 35.3 (LL) 09/23/2020 0300   HCO3 25.2 10/09/2020 0300   O2SAT 59.4 09/18/2020 0300     Coagulation Profile: No results for input(s): INR, PROTIME in the last 168 hours.  Cardiac Enzymes: No results for input(s): CKTOTAL, CKMB, CKMBINDEX, TROPONINI in the last 168 hours.  HbA1C: Hgb A1c MFr Bld  Date/Time Value Ref Range Status  09/14/2020 12:46 AM 6.4 (H) 4.8 - 5.6 % Final    Comment:    (NOTE) Pre diabetes:          5.7%-6.4%  Diabetes:              >6.4%  Glycemic control for   <7.0% adults with diabetes     CBG: Recent Labs  Lab 10/03/20 2055 10/04/20 0828 10/04/20 1218 10/04/20 1623 10/04/20 2008  GLUCAP 135* 108* 145* 115* 157*    Review of Systems:   Unable to obtain  Past Medical History  She,  has a past medical history of HTN (hypertension), Sickle cell trait (HCC), and Tobacco abuse.   Surgical History   History reviewed. No pertinent surgical history.    Social History   reports that she has quit smoking. She has never used smokeless tobacco. She reports previous alcohol use. She reports that she does not use drugs.   Family History   Her family history includes Cancer in her mother; Diabetes in her mother.   Allergies No Known Allergies   Home Medications  Prior to Admission medications   Medication Sig Start Date End Date Taking? Authorizing Provider  lactose free nutrition (BOOST) LIQD Take 237 mLs by mouth daily.   Yes [provider]  OVER THE COUNTER MEDICATION Take 240 mLs by mouth daily. Elderberry/echinacea tea   Yes [provider]  Phenyleph-Doxylamine-DM-APAP (TYLENOL COLD/FLU/COUGH NIGHT PO) Take 30 mLs by mouth every 12 (twelve) hours as needed (cough/congestion).   Yes [provider]  amLODipine (NORVASC) 5 MG tablet Take 1 tablet (5 mg total) by mouth daily. Patient not taking: Reported on 2020-09-24  02/22/17   Wallis Bamberg, PA-C  cyclobenzaprine (FLEXERIL) 5 MG tablet Take 1 tablet (5 mg total) by mouth 3 (three) times daily as needed. Patient not taking: Reported on 09-22-20 02/13/17   Wallis Bamberg, PA-C     Critical care time: Over 35 minutes was spent bedside evaluation chart review and critical care planning outside of procedures.

## 2020-10-11 NOTE — Death Summary Note (Signed)
DEATH SUMMARY    Date of admit: 09/23/2020  1:22 PM Date of death: 18-Oct-2020 Length of Stay: 25 days  PCP is Pcp, No  CAUSE(S) OF DEATH  Cardiac arrest   PROBLEM LIST Principal Problem:   Acute hypoxemic respiratory failure due to COVID-19 [facility]) Active Problems:   Elevated liver enzymes   Hypertension   AKI (acute kidney injury) (HCC)   Thrombocytosis   Palliative care by specialist   Goals of care, counseling/discussion   DNR (do not resuscitate)    SUMMARY Hannah Torres was 74 y.o. patient admitted with acute hypoxic respiratory failure due to Covid19   who has a past medical history of HTN (hypertension), Sickle cell trait (HCC), and Tobacco abuse.  She has no past surgical history on file.   Admitted on Sep 23, 2020 with acute hypoxic respiratory failure due to Covid-19 pneumonia. She was treated with Remdesivir, steroids and baricitinib. She continued to require oxygen support via NRB. Patient unfortunately required additional oxygen support with 70L HFNC and 15L NRB. CXR showed worsening bilateral opacities. Palliative was consulted. Patient was made DNR after goals of care discussion with patient and her daughter due to poor prognosis. Patient was given morphine for air hunger. On 2023/10/19 patient became acutely short of breath and patient and patient's daughter changed her code status to Full code. Critical care was consulted and intubated the patient on Oct 19, 2023. Patient continued to decline with O2 saturations in the 60s-80s. Nimbex was started. [name] at this point requested no attempts at CPR, ACLS drugs, or cardioversion due to the poor prognosis. Patient continued to decline and underwent cardiac arrest at 0753 on 10-18-2020 with daughter at bedside.   SIGNED  Jackelyn Poling, DO   10-18-20, 8:05 AM   Oct 18, 2020 8:05 AM

## 2020-10-11 NOTE — Significant Event (Signed)
The nurse notified me that the patient has become acutely short of breath suddenly.  Reviewed patient's chart and labs.  On exam at bedside patient is tachypneic tachycardic and is on heated high flow nasal cannula and nonrebreather was placed on BiPAP despite which patient is still hypoxic.  Patient is not wheezing on exam and has no crackles.  Chest x-ray shows persistent infiltrates with no new changes and monitor shows A. fib with RVR.  Blood pressure is 130/80 heart rate is around 160 bpm.  20 mg IV Lasix was given with not much difference critical care is consulted.  BNP troponin lactic acid ordered.  [name] at the bedside was updated about patient's condition.  Patient has changed her CODE STATUS back to full code.  Critical care physician updated.  Midge Minium

## 2020-10-11 NOTE — Progress Notes (Signed)
During pt respiratory distress she received 1 mg morphine out of a 2mg  vial, then due to decline she ended up needing to be intubated at bedside and transferred to ICU. Therefore, due to this emergency, myself and my co-worker nurse Dantonio forgot to waste the morphine sign off in the pyxis.

## 2020-10-11 NOTE — Significant Event (Addendum)
Rapid Response Event Note   Reason for Call : Acute Hypoxic respiratory failure  Initial Focused Assessment:  Nursing staff notified me of pt with sats 60% and tachypnea after using the bedpan. Pt currently on 35L HHFNC and 15L NRB mask at 100% FiO2. Pt in significant distress with accessory muscle use. RR 35-40. Skin is pink, warm and diaphoretic. BBS Coarse Rhonchi bilaterally with more coarse sounds on the left. Daughter at bedside. Will get PCXR (last PCXR 10/21) and NIPPV.   0245-HR 145 ST, 129/78 (95), RR 50 with sats 75-80% on Bipap 16/10  Dr. Toniann Fail at bedside. Pt and daughter agreed to Intubation if required.  PCCM consulted for urgent intubation.   Interventions:  -STAT PCXR -BIPAP per RT settings -STAT ABG on BIPAP -Tx to 2M15 following intubation   Event Summary: tx 2M15  MD Notified: Dr Toniann Fail @ [phone]- will come see pt. Call Time: 0238 Arrival Time: 0239 End Time: 0430  Rose Fillers, RN

## 2020-10-11 NOTE — Procedures (Signed)
Endotracheal Intubation Procedure Note  Indication for endotracheal intubation: respiratory failure. Airway Assessment: Mallampati Class: II (hard and soft palate, upper portion of tonsils anduvula visible). Sedation: etomidate, fentanyl and midazolam. Paralytic: none. Lidocaine: no. Atropine: no. Equipment: Macintosh 3 laryngoscope blade. Cricoid Pressure: no. Number of attempts: 1. ETT location confirmed by by auscultation, by CXR and ETCO2 monitor.  Hannah Torres 10/06/2020

## 2020-10-11 NOTE — Progress Notes (Signed)
eLink Physician-Brief Progress Note Patient Name: Hannah Torres DOB: 1946-11-07 MRN: [id number]   Date of Service  09/16/2020  HPI/Events of Note  Sedation with Fentanyl alone not adequate for NMB. Triglyceride level elevated on admission. Will avoid Propofol IV infusion.  eICU Interventions  Plan: 1. Add Versed IV infusion per NMB protocol.      Intervention Category Major Interventions: Respiratory failure - evaluation and management  Rashel Okeefe Eugene 09/21/2020, 5:24 AM

## 2020-10-11 DEATH — deceased

## 2020-10-13 ENCOUNTER — Telehealth: Payer: Self-pay | Admitting: Family Medicine

## 2020-10-13 NOTE — Telephone Encounter (Signed)
Death Certificate was dropped off by Kizzie Furnish. [facility] and has been placed in PCP's box for completion. Please use blue or black ink. Please return to [facility] once completed.

## 2020-10-13 NOTE — Telephone Encounter (Signed)
Called Hannah Torres. [facility] to pick-up death certificate. Financial planner, Lauro Regulus.

## 2020-10-13 NOTE — Telephone Encounter (Signed)
Completed and returned to [facility].

## 2022-04-17 IMAGING — US US ABDOMEN LIMITED
1 series · 14 of 19 positions shown · non-contrast
Comparison: None.

CLINICAL DATA: Elevated LFTs

EXAM:
ULTRASOUND ABDOMEN LIMITED RIGHT UPPER QUADRANT

[Series 1: us abdomen limited ruq · 14 of 19 slices shown]
[im 1/19]
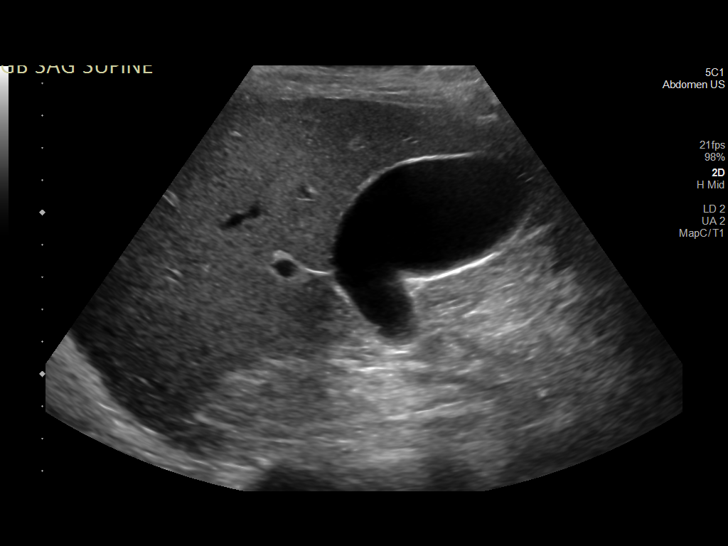
[im 3/19]
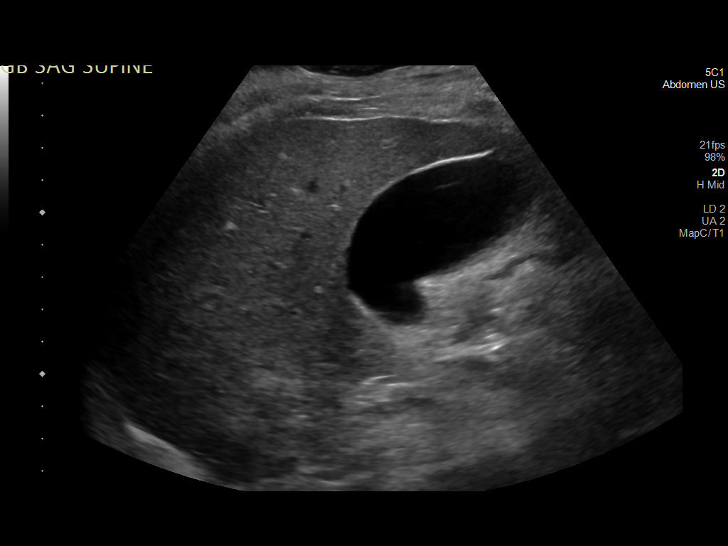
[im 4/19]
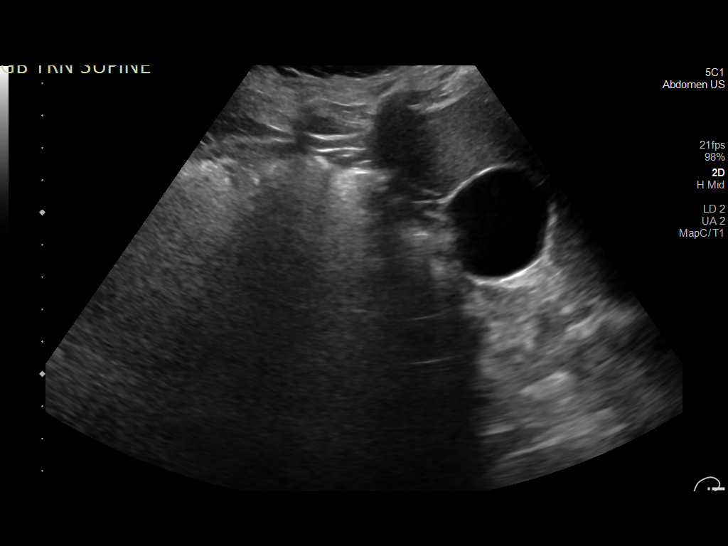
[im 5/19]
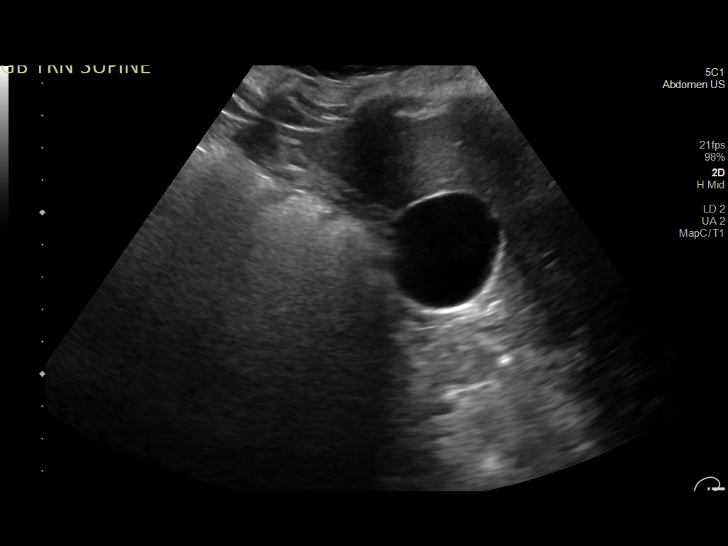
[im 7/19]
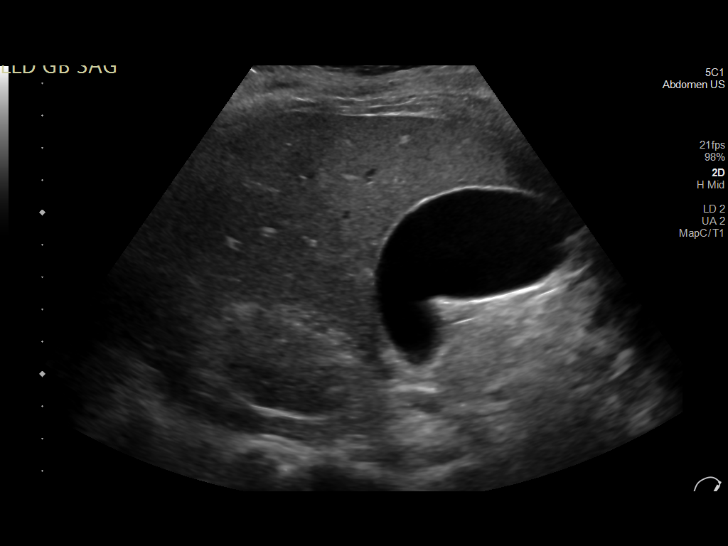
[im 8/19]
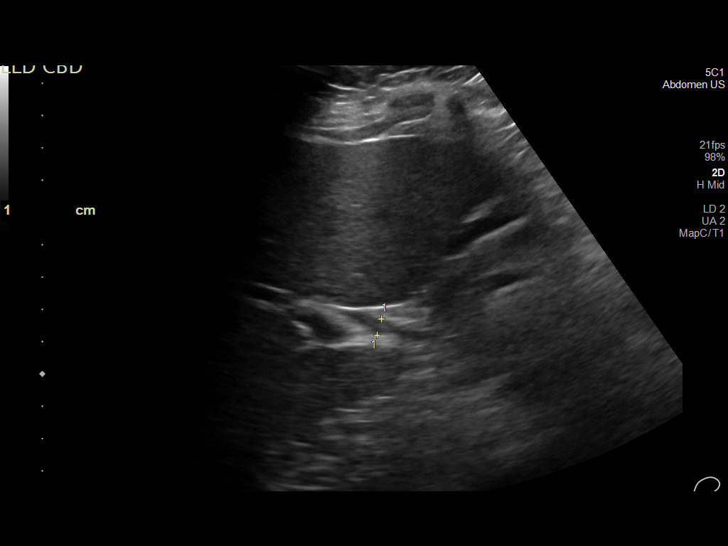
[im 9/19]
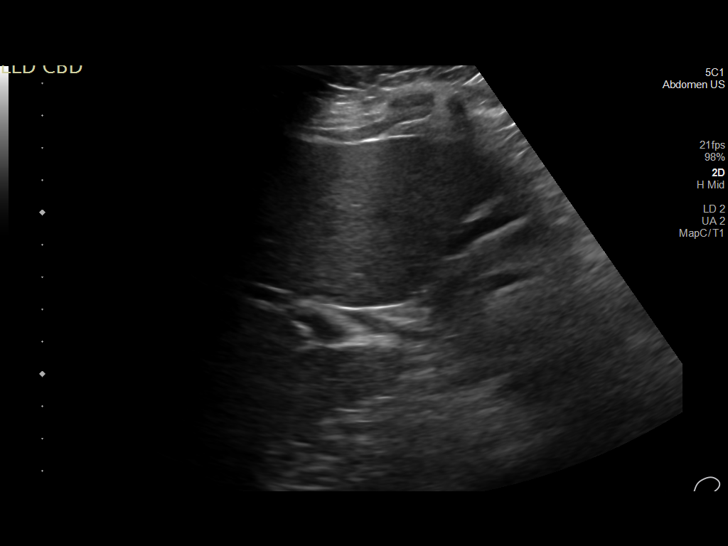
[im 11/19]
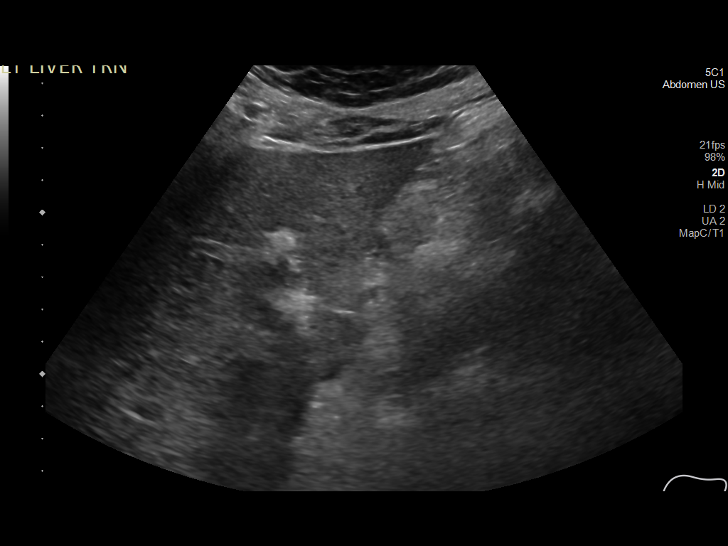
[im 12/19]
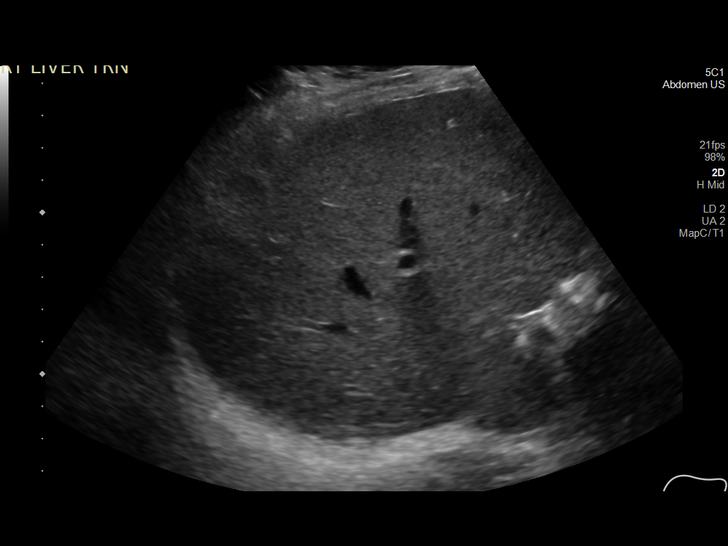
[im 13/19]
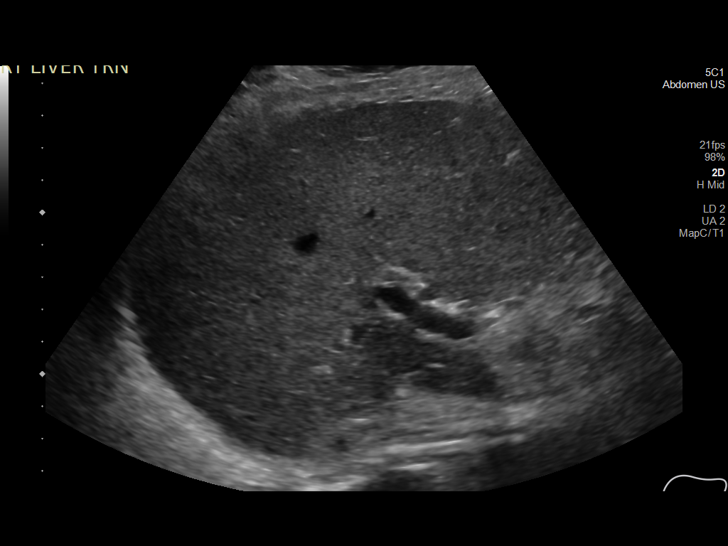
[im 15/19]
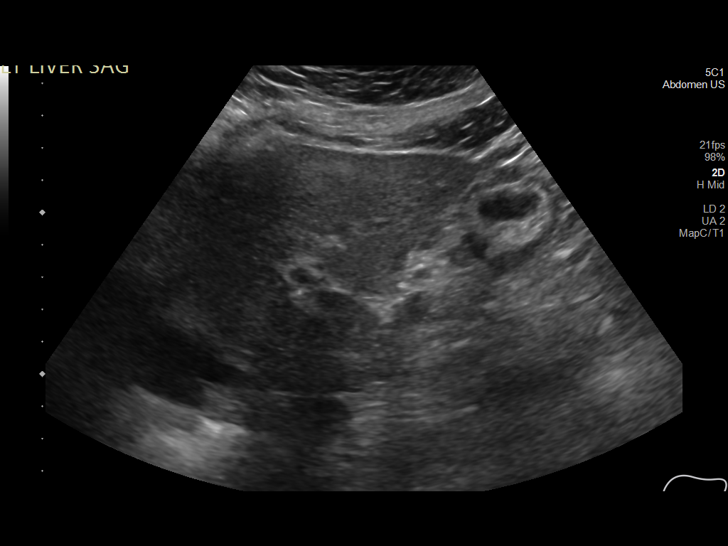
[im 16/19]
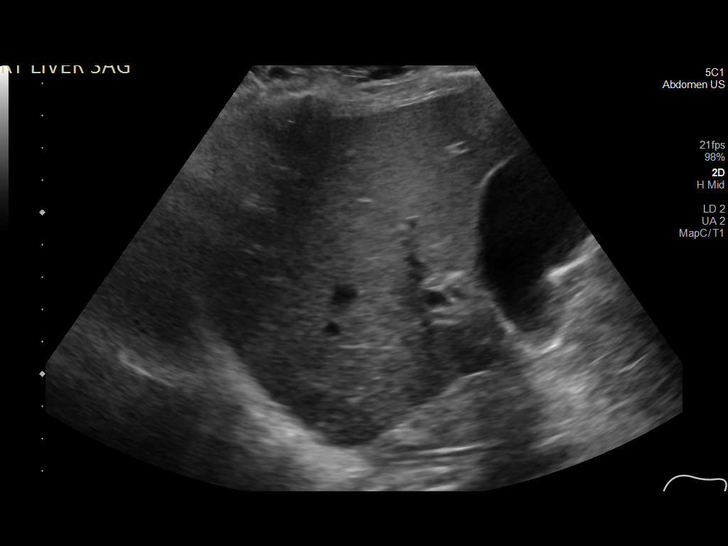
[im 17/19]
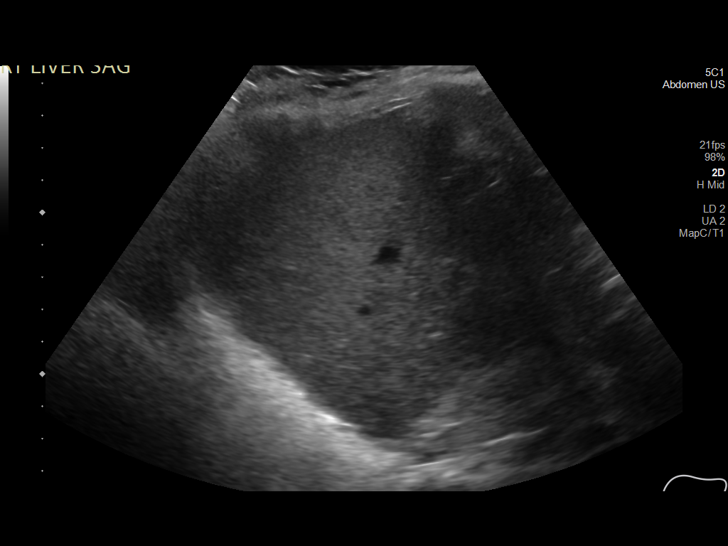
[im 19/19]
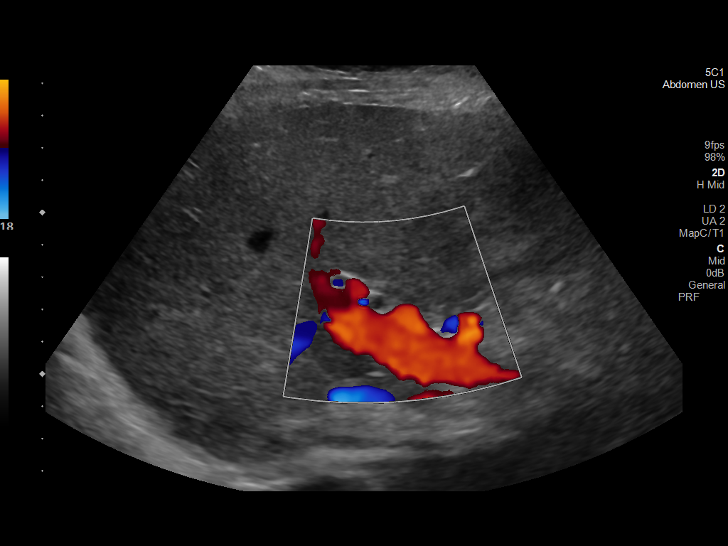

[14 of 19 positions shown; findings below may reference images not displayed]

FINDINGS: Gallbladder:

No gallstones or wall thickening visualized. No sonographic Murphy
sign noted by sonographer.

Common bile duct:

Diameter: 5.2 mm

Liver:

No focal lesion identified. Within normal limits in parenchymal
echogenicity. Portal vein is patent on color Doppler imaging with
normal direction of blood flow towards the liver.

Other: None.
IMPRESSION: No acute abnormality noted.

## 2022-05-02 IMAGING — DX DG CHEST 1V PORT
1 series · 1 of 1 positions shown · non-contrast
Comparison: Chest radiograph 09/30/2020.

CLINICAL DATA: Acute respiratory distress, rapid response. COVID
positive.

EXAM:
PORTABLE CHEST 1 VIEW

[chest ap]
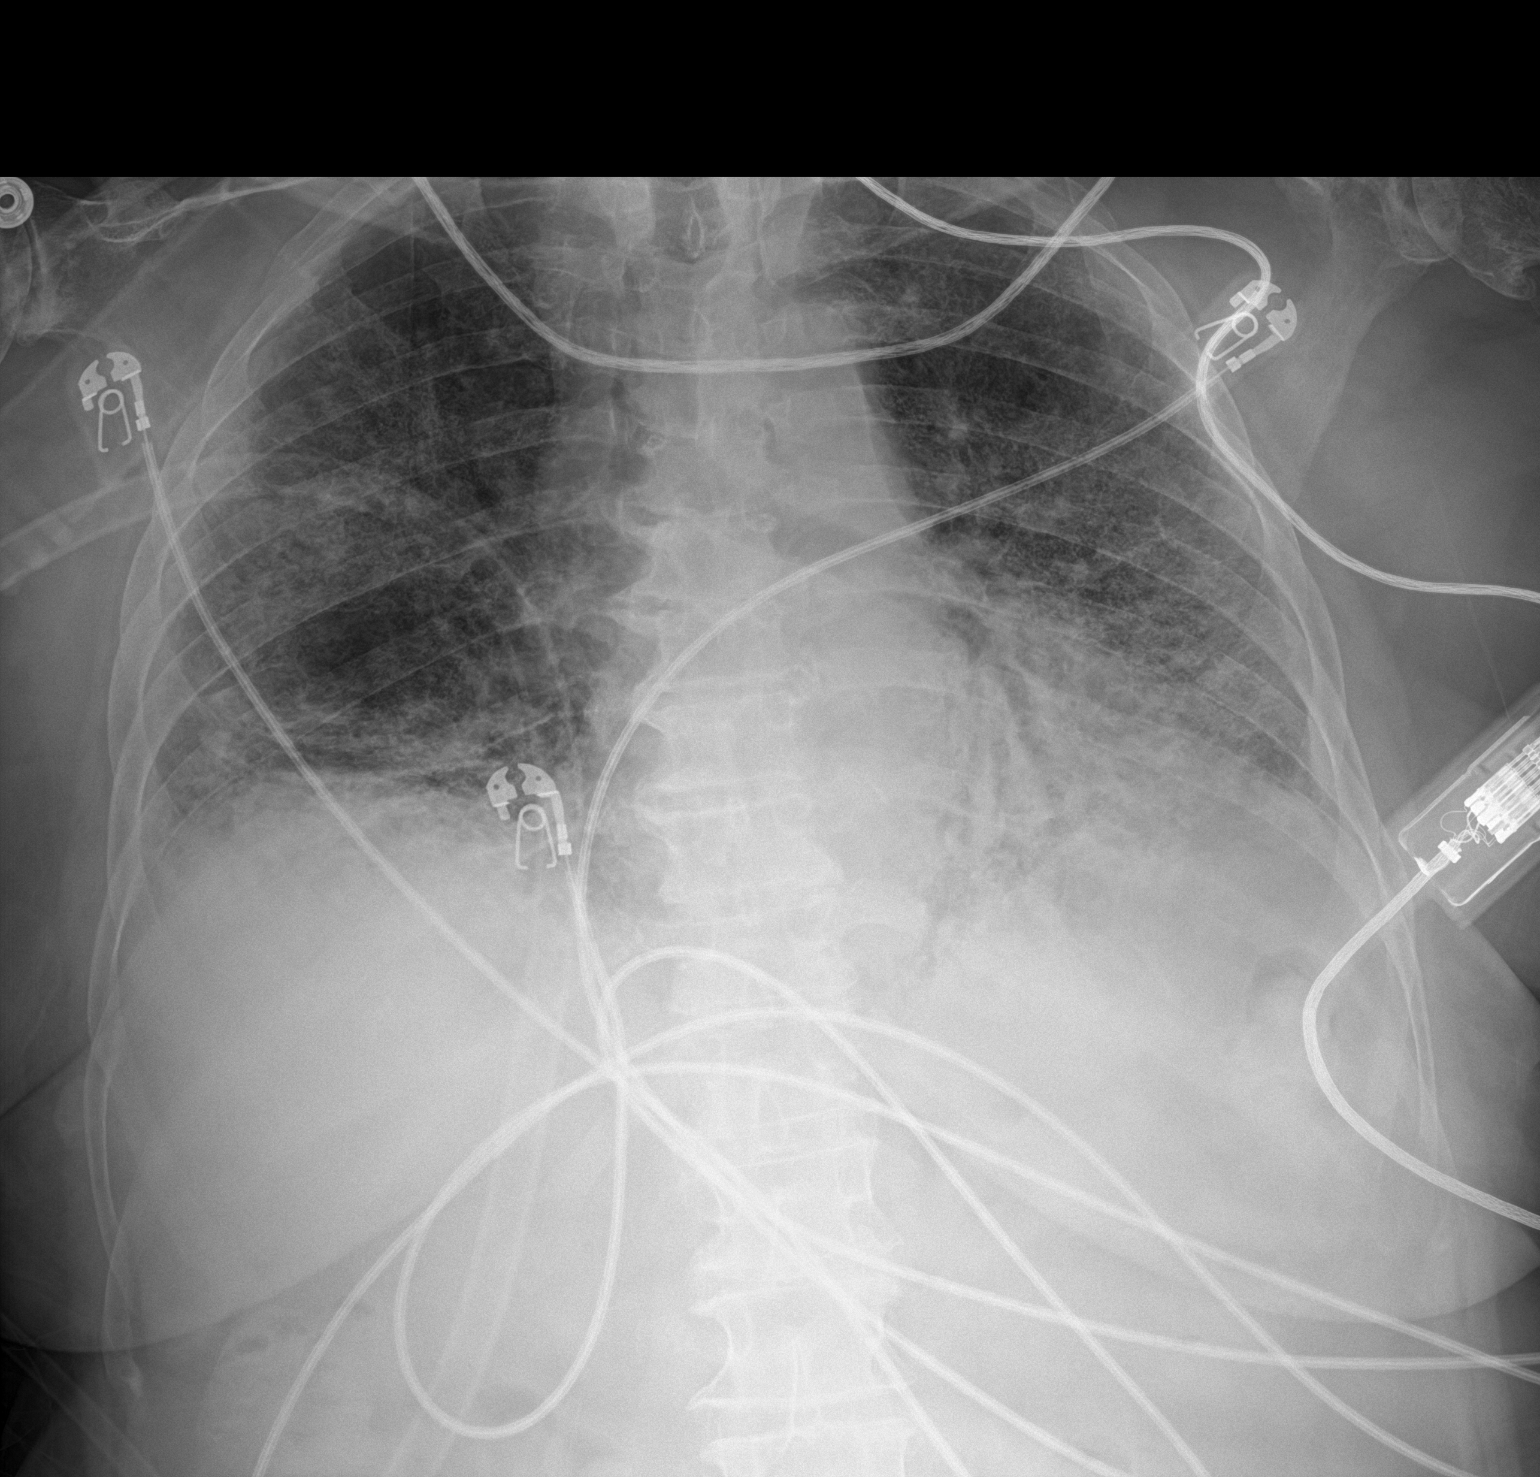

[1 of 1 positions shown; findings below may reference images not displayed]

FINDINGS: Persistent low lung volumes. Patchy and heterogeneous bilateral lung
opacities in a mid lower lung zone predominant distribution, similar
to most recent exam. No evidence of pneumomediastinum or
pneumothorax. Stable heart size. No large volume pleural fluid.
Chronic degenerative change of the shoulders and throughout the
spine.
IMPRESSION: Persistent low lung volumes with unchanged bilateral airspace
disease consistent with known [id number] pneumonia. No new
abnormality.
